# Patient Record
Sex: Female | Born: 1960 | Race: White | Hispanic: No | Marital: Married | Smoking: Current every day smoker
Health system: Southern US, Community
[De-identification: ages and names within clinical notes are randomized; demographics above are authoritative.]

## PROBLEM LIST (undated history)

## (undated) DIAGNOSIS — F17200 Nicotine dependence, unspecified, uncomplicated: Secondary | ICD-10-CM

## (undated) DIAGNOSIS — E7800 Pure hypercholesterolemia, unspecified: Secondary | ICD-10-CM

## (undated) DIAGNOSIS — G5600 Carpal tunnel syndrome, unspecified upper limb: Secondary | ICD-10-CM

## (undated) DIAGNOSIS — I700 Atherosclerosis of aorta: Secondary | ICD-10-CM

## (undated) DIAGNOSIS — K449 Diaphragmatic hernia without obstruction or gangrene: Secondary | ICD-10-CM

## (undated) DIAGNOSIS — D509 Iron deficiency anemia, unspecified: Secondary | ICD-10-CM

## (undated) DIAGNOSIS — I2510 Atherosclerotic heart disease of native coronary artery without angina pectoris: Secondary | ICD-10-CM

## (undated) HISTORY — PX: COLONOSCOPY: SHX174

## (undated) HISTORY — PX: ESOPHAGOGASTRODUODENOSCOPY: SHX1529

---

## 2005-01-02 ENCOUNTER — Ambulatory Visit: Payer: Self-pay | Admitting: Family Medicine

## 2005-02-28 ENCOUNTER — Ambulatory Visit: Payer: Self-pay | Admitting: Family Medicine

## 2007-04-28 ENCOUNTER — Ambulatory Visit: Payer: Self-pay | Admitting: Family Medicine

## 2008-07-12 ENCOUNTER — Ambulatory Visit: Payer: Self-pay | Admitting: Family Medicine

## 2009-07-26 ENCOUNTER — Ambulatory Visit: Payer: Self-pay | Admitting: Family Medicine

## 2010-10-01 ENCOUNTER — Ambulatory Visit: Payer: Self-pay | Admitting: Family Medicine

## 2014-04-21 ENCOUNTER — Ambulatory Visit: Payer: Self-pay | Admitting: Family Medicine

## 2014-05-11 ENCOUNTER — Ambulatory Visit: Payer: Self-pay | Admitting: Gastroenterology

## 2015-10-23 ENCOUNTER — Ambulatory Visit (INDEPENDENT_AMBULATORY_CARE_PROVIDER_SITE_OTHER): Payer: Managed Care, Other (non HMO) | Admitting: Sports Medicine

## 2015-10-23 ENCOUNTER — Ambulatory Visit (INDEPENDENT_AMBULATORY_CARE_PROVIDER_SITE_OTHER): Payer: Managed Care, Other (non HMO)

## 2015-10-23 ENCOUNTER — Encounter: Payer: Self-pay | Admitting: Podiatry

## 2015-10-23 VITALS — BP 129/71 | HR 101 | Resp 18

## 2015-10-23 DIAGNOSIS — M79672 Pain in left foot: Secondary | ICD-10-CM

## 2015-10-23 DIAGNOSIS — M79671 Pain in right foot: Secondary | ICD-10-CM

## 2015-10-23 DIAGNOSIS — R52 Pain, unspecified: Secondary | ICD-10-CM

## 2015-10-23 DIAGNOSIS — Q828 Other specified congenital malformations of skin: Secondary | ICD-10-CM

## 2015-10-23 MED ORDER — SILVER SULFADIAZINE 1 % EX CREA: 1.0000 "application " | TOPICAL_CREAM | Freq: Every day | CUTANEOUS | Status: DC

## 2015-10-23 MED ORDER — AMOXICILLIN-POT CLAVULANATE 875-125 MG PO TABS: 1.0000 | ORAL_TABLET | Freq: Two times a day (BID) | ORAL | Status: DC

## 2015-10-23 NOTE — Progress Notes (Deleted)
   Subjective:    Patient ID: Shari Melendez, female    DOB: 1961-03-13, 55 y.o.   MRN: [id number]  HPI I HAVE SOME CALLUSES ON BOTH OF MY BIG TOES AND I NEED MY CALLUSES TRIMMED UP AND I DO HAVE NEUROPATHY AND I AM A DIABETIC    Review of Systems  All other systems reviewed and are negative.      Objective:   Physical Exam        Assessment & Plan:

## 2015-10-23 NOTE — Progress Notes (Signed)
Patient ID: Shari Melendez, female   DOB: 01/29/1961, 55 y.o.   MRN: [id number] Subjective: Shari Melendez is a 55 y.o. female patient who presents to office for evaluation of Right> Left foot pain. Patient complains of pain at the lesions present Right>Left foot at the ball.  Patient has tried wart/callus remover, skin cream, and inserts and pads with no relief in symptoms. Patient denies any other pedal complaints.   There are no active problems to display for this patient.   No current outpatient prescriptions on file prior to visit.   No current facility-administered medications on file prior to visit.    Not on File  Objective:  General: Alert and oriented x3 in no acute distress  Dermatology: Keratotic lesions present sub met 3 and medial hallux on right and sub met 1, 5 and medial hallux on left with a central nucleated core noted, no webspace macerations, no ecchymosis bilateral, all nails x 10 are well manicured.  Vascular: Dorsalis Pedis and Posterior Tibial pedal pulses 2/4, Capillary Fill Time 3 seconds, + pedal hair growth bilateral, no edema bilateral lower extremities, Temperature gradient within normal limits.  Neurology: Johney Maine sensation intact via light touch bilateral.  Musculoskeletal: Mild tenderness with palpation at the lesion sites on Right>Left, Muscular strength 5/5 in all groups without pain or limitation on range of motion. Pes Cavus foot type bilateral.   Assessment and Plan: Problem List Items Addressed This Visit    None    Visit Diagnoses    Porokeratosis    -  Primary    Foot pain, bilateral          -Complete examination performed -Discussed treatment options of keratosis -Parred keratoic lesions using a chisel blade; treated the areas with Salinocaine covered with Moleskin; Keep intact for 1 day -Recommend daily skin emollients -Recommend good supportive shoes and inserts daily -Patient to return to office as needed or sooner if condition  worsens.  Landis Martins, DPM

## 2015-11-20 ENCOUNTER — Ambulatory Visit (INDEPENDENT_AMBULATORY_CARE_PROVIDER_SITE_OTHER): Payer: Managed Care, Other (non HMO) | Admitting: Sports Medicine

## 2015-11-20 ENCOUNTER — Telehealth: Payer: Self-pay | Admitting: *Deleted

## 2015-11-20 DIAGNOSIS — M79672 Pain in left foot: Secondary | ICD-10-CM | POA: Diagnosis not present

## 2015-11-20 DIAGNOSIS — M79671 Pain in right foot: Secondary | ICD-10-CM

## 2015-11-20 DIAGNOSIS — Q828 Other specified congenital malformations of skin: Secondary | ICD-10-CM

## 2015-11-20 NOTE — Telephone Encounter (Signed)
Left 5th MPJ plantar, and 1st MPJ skin wedge painful porokeratosis [facility]

## 2015-11-20 NOTE — Patient Instructions (Signed)

## 2015-11-20 NOTE — Progress Notes (Signed)
Patient ID: Shari Melendez, female   DOB: August 12, 1961, 55 y.o.   MRN: [id number]  Subjective: Shari Melendez is a 55 y.o. female patient who returns to office for evaluation of bilateral foot pain. Patient states that last trim helped tremendously but she wants the lesions cut out; desires to have the left foot done today and the right foot at a later time. Patient denies any other pedal complaints.   There are no active problems to display for this patient.   Current Outpatient Prescriptions on File Prior to Visit  Medication Sig Dispense Refill  . polyethylene glycol powder (GLYCOLAX/MIRALAX) powder      No current facility-administered medications on file prior to visit.    Not on File  Objective:  General: Alert and oriented x3 in no acute distress  Dermatology: Keratotic lesions <0.5cm present sub met 3 and medial hallux on right and sub met 1, 5 and medial hallux on left with a central nucleated core noted, no webspace macerations, no ecchymosis bilateral, all nails x 10 are well manicured.  Vascular: Dorsalis Pedis and Posterior Tibial pedal pulses 2/4, Capillary Fill Time 3 seconds, + pedal hair growth bilateral, no edema bilateral lower extremities, Temperature gradient within normal limits.  Neurology: Johney Maine sensation intact via light touch bilateral.  Musculoskeletal: Mild tenderness with palpation at the lesion sites on Left>Right, Muscular strength 5/5 in all groups without pain or limitation on range of motion. Pes Cavus foot type bilateral.   Assessment and Plan: Problem List Items Addressed This Visit    None    Visit Diagnoses    Porokeratosis    -  Primary    L>R     Relevant Orders    Dermatology pathology    Foot pain, bilateral          -Complete examination performed -Discussed treatment options of keratosis -After verbal consent for excision of left foot lesions injected a 3cc mixture of lidocaine and marine plain to sub met 1 and 5. A sterile betadine  prep was performed and then after anesthesia the lesions were excised in total and sent to [facility] for pathologic review. Treated areas with silver nitrate and dressed with neosporin, offloading pad, 4x4 and coban. Patient instructed to care for procedure sites daily with neosporin and bandage until healed -Dispensed post op shoe to use as instructed -Recommend ice, elevation and motrin as needed for pain -Advised patient to monitor for signs of infection if ocurs return to office or go to ER immediately -Recommend daily skin emollients for keratotic areas on contralateral side -Recommend good supportive shoes and inserts daily -Patient to return to office in 2 weeks to recheck excision sites or sooner if condition worsens.  Landis Martins, DPM

## 2015-12-07 ENCOUNTER — Encounter: Payer: Self-pay | Admitting: Sports Medicine

## 2015-12-07 ENCOUNTER — Ambulatory Visit (INDEPENDENT_AMBULATORY_CARE_PROVIDER_SITE_OTHER): Payer: Managed Care, Other (non HMO) | Admitting: Sports Medicine

## 2015-12-07 DIAGNOSIS — M79671 Pain in right foot: Secondary | ICD-10-CM

## 2015-12-07 DIAGNOSIS — Q828 Other specified congenital malformations of skin: Secondary | ICD-10-CM

## 2015-12-07 DIAGNOSIS — M6530 Trigger finger, unspecified finger: Secondary | ICD-10-CM | POA: Insufficient documentation

## 2015-12-07 DIAGNOSIS — M79672 Pain in left foot: Secondary | ICD-10-CM

## 2015-12-07 DIAGNOSIS — Z09 Encounter for follow-up examination after completed treatment for conditions other than malignant neoplasm: Secondary | ICD-10-CM

## 2015-12-07 DIAGNOSIS — G5600 Carpal tunnel syndrome, unspecified upper limb: Secondary | ICD-10-CM | POA: Insufficient documentation

## 2015-12-08 NOTE — Progress Notes (Signed)
Patient ID: Shari Melendez, female   DOB: Aug 20, 1960, 55 y.o.   MRN: [id number] Subjective: Shari Melendez is a 55 y.o. female patient who returns to office for evaluation s/p excision of porokeratosis left foot; patient reports that her foot feels better; she has been dressing sites with neosporin and bandaid without complication. Patient desires to have the lesion on her right foot taken off as well. Patient denies any other pedal complaints.   Patient Active Problem List   Diagnosis Date Noted  . Carpal tunnel syndrome 12/07/2015  . Triggering of digit 12/07/2015    No current outpatient prescriptions on file prior to visit.   No current facility-administered medications on file prior to visit.    Allergies  Allergen Reactions  . Sulfa Antibiotics Other (See Comments)    Other Reaction: SWELLING AND HIVES    Objective:  General: Alert and oriented x3 in no acute distress  Dermatology: Keratotic lesion present <0.5cm sub met 3> medial hallux on right with central core, Excision sites on left foot are healing well with minimal reactive keratosis, no webspace macerations, no ecchymosis bilateral, all nails x 10 are well manicured.  Vascular: Dorsalis Pedis and Posterior Tibial pedal pulses 2/4, Capillary Fill Time 3 seconds, + pedal hair growth bilateral, no edema bilateral lower extremities, Temperature gradient within normal limits.  Neurology: Johney Maine sensation intact via light touch bilateral.  Musculoskeletal: Mild tenderness with palpation at the lesion site sub met 3>medial hallux on Right, No pain to excision sites on left, Muscular strength 5/5 in all groups without pain or limitation on range of motion. Pes Cavus foot type bilateral.  Assessment and Plan: Problem List Items Addressed This Visit    None    Visit Diagnoses    S/P excision of skin lesion, follow-up exam    -  Primary    Left sub met 1 and 5    Porokeratosis        Foot pain, bilateral            -Complete examination performed -Discussed treatment options -Parred keratoic lesion using a chisel blade; treated the area with Salinocaine covered with bandaid on right sub met 3; will plan for excision of this lesion at next visit -On left excision sites are well healed, parred reactive keratosis at sites with sterile chisel blade without complication; no need at this time for dressings; recommend daily inspection -Recommend good supportive shoes and inserts -Recommend daily skin emollients -Patient to return to office for right foot excision or sooner if condition worsens.  Landis Martins, DPM

## 2016-01-04 ENCOUNTER — Encounter: Payer: Self-pay | Admitting: Sports Medicine

## 2016-01-04 ENCOUNTER — Ambulatory Visit (INDEPENDENT_AMBULATORY_CARE_PROVIDER_SITE_OTHER): Payer: Managed Care, Other (non HMO) | Admitting: Sports Medicine

## 2016-01-04 DIAGNOSIS — M79672 Pain in left foot: Secondary | ICD-10-CM

## 2016-01-04 DIAGNOSIS — Q828 Other specified congenital malformations of skin: Secondary | ICD-10-CM | POA: Diagnosis not present

## 2016-01-04 DIAGNOSIS — M79671 Pain in right foot: Secondary | ICD-10-CM

## 2016-01-04 NOTE — Progress Notes (Signed)
Patient ID: Shari Melendez, female   DOB: Sep 14, 1960, 55 y.o.   MRN: [id number] Subjective: Shari Melendez is a 55 y.o. female patient who returns to office for evaluation of bilateral foot pain. Patient states that her left foot is doing much better s/p Excision of porokeratosis. Desires to have the right foot keratotic lesion excised today. Patient denies any other pedal complaints.   Patient Active Problem List   Diagnosis Date Noted  . Carpal tunnel syndrome 12/07/2015  . Triggering of digit 12/07/2015    No current outpatient prescriptions on file prior to visit.   No current facility-administered medications on file prior to visit.    Allergies  Allergen Reactions  . Sulfa Antibiotics Other (See Comments)    Other Reaction: SWELLING AND HIVES    Objective:  General: Alert and oriented x3 in no acute distress  Dermatology: Keratotic lesions <0.5cm present sub met 3 and medial hallux on right with a central nucleated core noted and sub met 1, 5 and medial hallux on left that is reactive s/p excision, no webspace macerations, no ecchymosis bilateral, all nails x 10 are well manicured.  Vascular: Dorsalis Pedis and Posterior Tibial pedal pulses 2/4, Capillary Fill Time 3 seconds, + pedal hair growth bilateral, no edema bilateral lower extremities, Temperature gradient within normal limits.  Neurology: Johney Maine sensation intact via light touch bilateral.  Musculoskeletal: Mild tenderness with palpation at the lesion sites on Left<Right, Muscular strength 5/5 in all groups without pain or limitation on range of motion. Pes Cavus foot type bilateral.   Assessment and Plan: Problem List Items Addressed This Visit    None    Visit Diagnoses    Porokeratosis    -  Primary    Relevant Orders    Wound culture    Foot pain, bilateral          -Complete examination performed -Discussed treatment options of keratosis -After verbal consent for excision of left foot lesions injected a  3cc mixture of lidocaine and marine plain to sub met 3 on right and a sterile betadine prep was performed and then after anesthesia the lesion was excised in total and sent for pathologic review. Treated areas with silver nitrate and dressed with neosporin, 4x4 and coban. Patient instructed to care for procedure sites daily with neosporin and bandage until healed -Advised patient to use post op shoe on right -Recommend ice, elevation and motrin as needed for pain -Advised patient to monitor for signs of infection if ocurs return to office or go to ER immediately -Recommend daily skin emollients for keratotic areas on contralateral side -Patient to return to office in 1-2 weeks to recheck excision sites or sooner if condition worsens.  Landis Martins, DPM

## 2016-01-22 ENCOUNTER — Ambulatory Visit (INDEPENDENT_AMBULATORY_CARE_PROVIDER_SITE_OTHER): Payer: Managed Care, Other (non HMO) | Admitting: Sports Medicine

## 2016-01-22 ENCOUNTER — Encounter: Payer: Self-pay | Admitting: Sports Medicine

## 2016-01-22 DIAGNOSIS — M79671 Pain in right foot: Secondary | ICD-10-CM

## 2016-01-22 DIAGNOSIS — Z09 Encounter for follow-up examination after completed treatment for conditions other than malignant neoplasm: Secondary | ICD-10-CM

## 2016-01-22 DIAGNOSIS — M79672 Pain in left foot: Secondary | ICD-10-CM

## 2016-01-22 NOTE — Progress Notes (Signed)
Patient ID: Shari LargeLisa D Calabrese, female   DOB: [birth year], 55 y.o.   MRN: [id number]  Subjective: Shari Melendez is a 55 y.o. female patient who returns to office for evaluation s/p excision of porokeratosis right foot; patient reports that her foot feels better; she has been dressing sites with neosporin and bandaid without complication. Patient denies consitutional symptoms. Patient denies any other pedal complaints.   Patient Active Problem List   Diagnosis Date Noted  . Carpal tunnel syndrome 12/07/2015  . Triggering of digit 12/07/2015    No current outpatient prescriptions on file prior to visit.   No current facility-administered medications on file prior to visit.    Allergies  Allergen Reactions  . Sulfa Antibiotics Other (See Comments)    Other Reaction: SWELLING AND HIVES    Objective:  General: Alert and oriented x3 in no acute distress  Dermatology: Left foot excision sites well healed with mild reactive keratosis present. Most recent, Excision site on right foot is healing well with minimal reactive keratosis, no webspace macerations, no ecchymosis bilateral, all nails x 10 are well manicured.  Vascular: Dorsalis Pedis and Posterior Tibial pedal pulses 2/4, Capillary Fill Time 3 seconds, + pedal hair growth bilateral, no edema bilateral lower extremities, Temperature gradient within normal limits.  Neurology: Gross sensation intact via light touch bilateral.  Musculoskeletal: No tenderness to palpation bilateral, Muscular strength 5/5 in all groups without pain or limitation on range of motion. Pes Cavus foot type bilateral.  Path ([facility])- [facility]  Assessment and Plan: Problem List Items Addressed This Visit    None    Visit Diagnoses    S/P excision of skin lesion, follow-up exam    -  Primary    Right foot    Foot pain, bilateral        Improved      -Complete examination performed -Path reviewed  -On right excision site is well healed, parred reactive keratosis at  site with sterile chisel blade without complication; applied antibiotic cream and bandaid, advised patient to continue with this same dressing for 1 week, Soaks are NO LONGER needed; recommend daily inspection -Recommend good supportive shoes and inserts -Recommend daily skin emollients -Patient to return to office as needed or sooner if condition worsens.  Asencion Islamitorya , DPM

## 2016-01-23 ENCOUNTER — Encounter: Payer: Self-pay | Admitting: Sports Medicine

## 2017-03-30 ENCOUNTER — Telehealth: Payer: Self-pay | Admitting: *Deleted

## 2017-03-30 DIAGNOSIS — Z122 Encounter for screening for malignant neoplasm of respiratory organs: Secondary | ICD-10-CM

## 2017-03-30 DIAGNOSIS — Z87891 Personal history of nicotine dependence: Secondary | ICD-10-CM

## 2017-03-30 NOTE — Telephone Encounter (Signed)
Received referral for initial lung cancer screening scan. Contacted patient and obtained smoking history,(current, 38 pack year) as well as answering questions related to screening process. Patient denies signs of lung cancer such as weight loss or hemoptysis. Patient denies comorbidity that would prevent curative treatment if lung cancer were found. Patient is scheduled for shared decision making visit and CT scan on 04/14/17.

## 2017-04-06 ENCOUNTER — Other Ambulatory Visit: Payer: Self-pay | Admitting: Family Medicine

## 2017-04-06 DIAGNOSIS — Z1231 Encounter for screening mammogram for malignant neoplasm of breast: Secondary | ICD-10-CM

## 2017-04-13 ENCOUNTER — Encounter: Payer: Self-pay | Admitting: Oncology

## 2017-04-14 ENCOUNTER — Inpatient Hospital Stay: Payer: Managed Care, Other (non HMO) | Attending: Oncology | Admitting: Oncology

## 2017-04-14 ENCOUNTER — Ambulatory Visit
Admission: RE | Admit: 2017-04-14 | Discharge: 2017-04-14 | Disposition: A | Discharge status: Home / Self Care | Payer: Managed Care, Other (non HMO) | Source: Ambulatory Visit | Attending: Oncology | Admitting: Oncology

## 2017-04-14 DIAGNOSIS — I700 Atherosclerosis of aorta: Secondary | ICD-10-CM | POA: Diagnosis not present

## 2017-04-14 DIAGNOSIS — Z122 Encounter for screening for malignant neoplasm of respiratory organs: Secondary | ICD-10-CM | POA: Insufficient documentation

## 2017-04-14 DIAGNOSIS — J439 Emphysema, unspecified: Secondary | ICD-10-CM | POA: Diagnosis not present

## 2017-04-14 DIAGNOSIS — I2510 Atherosclerotic heart disease of native coronary artery without angina pectoris: Secondary | ICD-10-CM | POA: Diagnosis not present

## 2017-04-14 DIAGNOSIS — F17210 Nicotine dependence, cigarettes, uncomplicated: Secondary | ICD-10-CM

## 2017-04-14 DIAGNOSIS — Z87891 Personal history of nicotine dependence: Secondary | ICD-10-CM | POA: Insufficient documentation

## 2017-04-14 NOTE — Progress Notes (Signed)
In accordance with CMS guidelines, patient has met eligibility criteria including age, absence of signs or symptoms of lung cancer.  Social History  Substance Use Topics  . Smoking status: Current Every Day Smoker    Packs/day: 1.00    Years: 38.00  . Smokeless tobacco: Never Used  . Alcohol use No     A shared decision-making session was conducted prior to the performance of CT scan. This includes one or more decision aids, includes benefits and harms of screening, follow-up diagnostic testing, over-diagnosis, false positive rate, and total radiation exposure.  Counseling on the importance of adherence to annual lung cancer LDCT screening, impact of co-morbidities, and ability or willingness to undergo diagnosis and treatment is imperative for compliance of the program.  Counseling on the importance of continued smoking cessation for former smokers; the importance of smoking cessation for current smokers, and information about tobacco cessation interventions have been given to patient including [place] and [facility] quit [place] programs.  Written order for lung cancer screening with LDCT has been given to the patient and any and all questions have been answered to the best of my abilities.   Yearly follow up will be coordinated by Burgess Estelle, Thoracic Navigator.  Faythe Casa, NP 04/14/2017 1:31 PM

## 2017-04-17 ENCOUNTER — Encounter: Payer: Self-pay | Admitting: *Deleted

## 2017-05-08 ENCOUNTER — Ambulatory Visit
Admission: RE | Admit: 2017-05-08 | Discharge: 2017-05-08 | Disposition: A | Discharge status: Home / Self Care | Payer: Managed Care, Other (non HMO) | Source: Ambulatory Visit | Attending: Family Medicine | Admitting: Family Medicine

## 2017-05-08 ENCOUNTER — Encounter: Payer: Self-pay | Admitting: Radiology

## 2017-05-08 DIAGNOSIS — Z1231 Encounter for screening mammogram for malignant neoplasm of breast: Secondary | ICD-10-CM | POA: Insufficient documentation

## 2018-04-03 ENCOUNTER — Telehealth: Payer: Self-pay

## 2018-04-03 NOTE — Telephone Encounter (Signed)
Call pt regarding lung screening. Pt is a current smoker. Smokes about 1/2 pack per day. Scan can be any day and time.

## 2018-04-05 ENCOUNTER — Other Ambulatory Visit: Payer: Self-pay | Admitting: *Deleted

## 2018-04-05 DIAGNOSIS — Z122 Encounter for screening for malignant neoplasm of respiratory organs: Secondary | ICD-10-CM

## 2018-04-06 ENCOUNTER — Telehealth: Payer: Self-pay | Admitting: *Deleted

## 2018-04-06 NOTE — Telephone Encounter (Signed)
Called patient to discuss her appt for LDCT screening on Thursday 04/08/2018 @ 8:15am here @ [facility], unable to reach her by phone, message left with appt information.

## 2018-04-08 ENCOUNTER — Telehealth: Payer: Self-pay | Admitting: *Deleted

## 2018-04-08 ENCOUNTER — Ambulatory Visit: Admission: RE | Admit: 2018-04-08 | Payer: Managed Care, Other (non HMO) | Source: Ambulatory Visit

## 2018-04-08 NOTE — Telephone Encounter (Signed)
Called patient to inform her of her appt for LDCT screening on Monday 04/12/2018 @ 9:40am here @ [facility]. Voiced understanding.

## 2018-04-08 NOTE — Telephone Encounter (Signed)
Attempted to contact patient r/t LDCT Screening follow was due today, but patient was a no show to their appt.   No answer received, message left for patient to call [phone] to schedule appointment.

## 2018-04-12 ENCOUNTER — Ambulatory Visit
Admission: RE | Admit: 2018-04-12 | Discharge: 2018-04-12 | Disposition: A | Discharge status: Home / Self Care | Payer: Managed Care, Other (non HMO) | Source: Ambulatory Visit | Attending: Nurse Practitioner | Admitting: Nurse Practitioner

## 2018-04-12 DIAGNOSIS — I700 Atherosclerosis of aorta: Secondary | ICD-10-CM | POA: Diagnosis not present

## 2018-04-12 DIAGNOSIS — I2510 Atherosclerotic heart disease of native coronary artery without angina pectoris: Secondary | ICD-10-CM | POA: Insufficient documentation

## 2018-04-12 DIAGNOSIS — Z87891 Personal history of nicotine dependence: Secondary | ICD-10-CM | POA: Insufficient documentation

## 2018-04-12 DIAGNOSIS — Z122 Encounter for screening for malignant neoplasm of respiratory organs: Secondary | ICD-10-CM | POA: Insufficient documentation

## 2018-04-12 DIAGNOSIS — J432 Centrilobular emphysema: Secondary | ICD-10-CM | POA: Insufficient documentation

## 2018-04-12 DIAGNOSIS — J438 Other emphysema: Secondary | ICD-10-CM | POA: Insufficient documentation

## 2018-04-13 ENCOUNTER — Other Ambulatory Visit: Payer: Self-pay | Admitting: Family Medicine

## 2018-04-13 DIAGNOSIS — Z1231 Encounter for screening mammogram for malignant neoplasm of breast: Secondary | ICD-10-CM

## 2018-04-16 ENCOUNTER — Encounter: Payer: Self-pay | Admitting: *Deleted

## 2018-04-20 DIAGNOSIS — Z720 Tobacco use: Secondary | ICD-10-CM | POA: Insufficient documentation

## 2018-05-12 ENCOUNTER — Ambulatory Visit
Admission: RE | Admit: 2018-05-12 | Discharge: 2018-05-12 | Disposition: A | Discharge status: Home / Self Care | Payer: Managed Care, Other (non HMO) | Source: Ambulatory Visit | Attending: Family Medicine | Admitting: Family Medicine

## 2018-05-12 DIAGNOSIS — Z1231 Encounter for screening mammogram for malignant neoplasm of breast: Secondary | ICD-10-CM | POA: Diagnosis present

## 2019-04-21 ENCOUNTER — Telehealth: Payer: Self-pay | Admitting: *Deleted

## 2019-04-21 NOTE — Telephone Encounter (Signed)
Left message for patient to notify them that it is time to schedule annual low dose lung cancer screening CT scan. Instructed patient to call back to verify information prior to the scan being scheduled.  

## 2019-06-09 ENCOUNTER — Other Ambulatory Visit: Payer: Self-pay | Admitting: Family Medicine

## 2019-07-19 ENCOUNTER — Telehealth: Payer: Self-pay

## 2019-07-19 NOTE — Telephone Encounter (Signed)
Left message for patient to notify them that it is time to schedule annual low dose lung cancer screening CT scan. Instructed patient to call back ([phone]) to verify information prior to the scan being scheduled.  

## 2019-07-26 ENCOUNTER — Encounter: Payer: Self-pay | Admitting: *Deleted

## 2019-09-26 DIAGNOSIS — I2510 Atherosclerotic heart disease of native coronary artery without angina pectoris: Secondary | ICD-10-CM | POA: Insufficient documentation

## 2019-09-27 ENCOUNTER — Other Ambulatory Visit: Payer: Self-pay | Admitting: Family Medicine

## 2019-09-27 DIAGNOSIS — Z1231 Encounter for screening mammogram for malignant neoplasm of breast: Secondary | ICD-10-CM

## 2019-09-30 ENCOUNTER — Telehealth: Payer: Self-pay | Admitting: *Deleted

## 2019-09-30 DIAGNOSIS — Z87891 Personal history of nicotine dependence: Secondary | ICD-10-CM

## 2019-09-30 NOTE — Telephone Encounter (Signed)
Patient has been notified that annual lung cancer screening low dose CT scan is due currently or will be in near future. Confirmed that patient is within the age range of 55-77, and asymptomatic, (no signs or symptoms of lung cancer). Patient denies illness that would prevent curative treatment for lung cancer if found. Verified smoking history, (current, 38.25 pack year). The shared decision making visit was done 04/14/17. Patient is agreeable for CT scan being scheduled.

## 2019-10-11 ENCOUNTER — Other Ambulatory Visit: Payer: Self-pay

## 2019-10-11 ENCOUNTER — Ambulatory Visit
Admission: RE | Admit: 2019-10-11 | Discharge: 2019-10-11 | Disposition: A | Discharge status: Home / Self Care | Payer: Managed Care, Other (non HMO) | Source: Ambulatory Visit | Attending: Oncology | Admitting: Oncology

## 2019-10-11 DIAGNOSIS — Z87891 Personal history of nicotine dependence: Secondary | ICD-10-CM | POA: Insufficient documentation

## 2019-10-14 ENCOUNTER — Encounter: Payer: Self-pay | Admitting: *Deleted

## 2019-11-08 ENCOUNTER — Ambulatory Visit
Admission: RE | Admit: 2019-11-08 | Discharge: 2019-11-08 | Disposition: A | Discharge status: Home / Self Care | Payer: Managed Care, Other (non HMO) | Source: Ambulatory Visit | Attending: Family Medicine | Admitting: Family Medicine

## 2019-11-08 DIAGNOSIS — Z1231 Encounter for screening mammogram for malignant neoplasm of breast: Secondary | ICD-10-CM

## 2019-12-26 DIAGNOSIS — E7800 Pure hypercholesterolemia, unspecified: Secondary | ICD-10-CM | POA: Insufficient documentation

## 2020-10-10 ENCOUNTER — Telehealth: Payer: Self-pay | Admitting: *Deleted

## 2020-10-10 NOTE — Telephone Encounter (Signed)
Attempted to contact and schedule lung screening scan. Message left for patient to call back to schedule. 

## 2020-10-11 ENCOUNTER — Other Ambulatory Visit: Payer: Self-pay | Admitting: Family Medicine

## 2020-10-11 ENCOUNTER — Other Ambulatory Visit: Payer: Self-pay | Admitting: *Deleted

## 2020-10-11 DIAGNOSIS — F17200 Nicotine dependence, unspecified, uncomplicated: Secondary | ICD-10-CM

## 2020-10-11 DIAGNOSIS — Z1231 Encounter for screening mammogram for malignant neoplasm of breast: Secondary | ICD-10-CM

## 2020-10-11 DIAGNOSIS — Z87891 Personal history of nicotine dependence: Secondary | ICD-10-CM

## 2020-10-11 DIAGNOSIS — Z122 Encounter for screening for malignant neoplasm of respiratory organs: Secondary | ICD-10-CM

## 2020-10-11 NOTE — Progress Notes (Signed)
Contacted and scheduled for annual lung cancer screening scan. Patient is a current smoker, 38.5 pack year history.

## 2020-10-19 ENCOUNTER — Other Ambulatory Visit: Payer: Self-pay

## 2020-10-19 ENCOUNTER — Ambulatory Visit
Admission: RE | Admit: 2020-10-19 | Discharge: 2020-10-19 | Disposition: A | Discharge status: Home / Self Care | Payer: No Typology Code available for payment source | Source: Ambulatory Visit | Attending: Nurse Practitioner | Admitting: Nurse Practitioner

## 2020-10-19 DIAGNOSIS — Z122 Encounter for screening for malignant neoplasm of respiratory organs: Secondary | ICD-10-CM | POA: Diagnosis present

## 2020-10-19 DIAGNOSIS — Z87891 Personal history of nicotine dependence: Secondary | ICD-10-CM | POA: Insufficient documentation

## 2020-10-19 DIAGNOSIS — F17200 Nicotine dependence, unspecified, uncomplicated: Secondary | ICD-10-CM | POA: Insufficient documentation

## 2020-10-26 ENCOUNTER — Encounter: Payer: Self-pay | Admitting: *Deleted

## 2020-11-08 ENCOUNTER — Ambulatory Visit
Admission: RE | Admit: 2020-11-08 | Discharge: 2020-11-08 | Disposition: A | Discharge status: Home / Self Care | Payer: No Typology Code available for payment source | Source: Ambulatory Visit | Attending: Family Medicine | Admitting: Family Medicine

## 2020-11-08 ENCOUNTER — Other Ambulatory Visit: Payer: Self-pay

## 2020-11-08 DIAGNOSIS — Z1231 Encounter for screening mammogram for malignant neoplasm of breast: Secondary | ICD-10-CM | POA: Diagnosis present

## 2021-12-06 ENCOUNTER — Telehealth: Payer: Self-pay | Admitting: *Deleted

## 2021-12-06 NOTE — Telephone Encounter (Signed)
LMTC to schedule Yearly Lung CA CT Scan. 

## 2022-10-20 ENCOUNTER — Other Ambulatory Visit: Payer: Self-pay | Admitting: Family Medicine

## 2022-10-20 DIAGNOSIS — Z1231 Encounter for screening mammogram for malignant neoplasm of breast: Secondary | ICD-10-CM

## 2022-11-05 ENCOUNTER — Ambulatory Visit
Admission: RE | Admit: 2022-11-05 | Discharge: 2022-11-05 | Disposition: A | Discharge status: Home / Self Care | Payer: No Typology Code available for payment source | Source: Ambulatory Visit | Attending: Family Medicine | Admitting: Family Medicine

## 2022-11-05 DIAGNOSIS — Z1231 Encounter for screening mammogram for malignant neoplasm of breast: Secondary | ICD-10-CM | POA: Insufficient documentation

## 2022-11-07 ENCOUNTER — Other Ambulatory Visit: Payer: Self-pay | Admitting: Family Medicine

## 2022-11-07 DIAGNOSIS — R928 Other abnormal and inconclusive findings on diagnostic imaging of breast: Secondary | ICD-10-CM

## 2022-11-07 DIAGNOSIS — N6489 Other specified disorders of breast: Secondary | ICD-10-CM

## 2022-11-11 ENCOUNTER — Ambulatory Visit
Admission: RE | Admit: 2022-11-11 | Discharge: 2022-11-11 | Disposition: A | Discharge status: Home / Self Care | Payer: No Typology Code available for payment source | Source: Ambulatory Visit | Attending: Family Medicine | Admitting: Family Medicine

## 2022-11-11 DIAGNOSIS — R928 Other abnormal and inconclusive findings on diagnostic imaging of breast: Secondary | ICD-10-CM

## 2022-11-11 DIAGNOSIS — N6489 Other specified disorders of breast: Secondary | ICD-10-CM | POA: Insufficient documentation

## 2023-02-20 IMAGING — MG MM DIGITAL SCREENING BILAT W/ TOMO AND CAD
8 series · 8 of 24 positions shown · non-contrast
Comparison: Previous exam(s).

CLINICAL DATA: Screening.

EXAM:
DIGITAL SCREENING BILATERAL MAMMOGRAM WITH TOMOSYNTHESIS AND CAD
TECHNIQUE: Bilateral screening digital craniocaudal and mediolateral oblique
mammograms were obtained. Bilateral screening digital breast
tomosynthesis was performed. The images were evaluated with
computer-aided detection.

[R CC synth-2D]
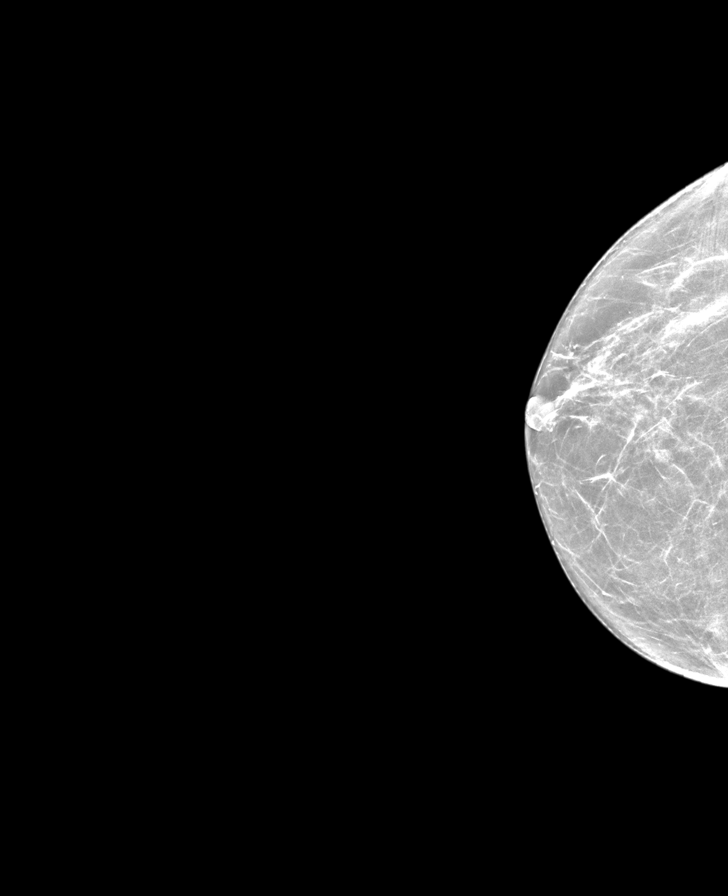

[R MLO synth-2D]
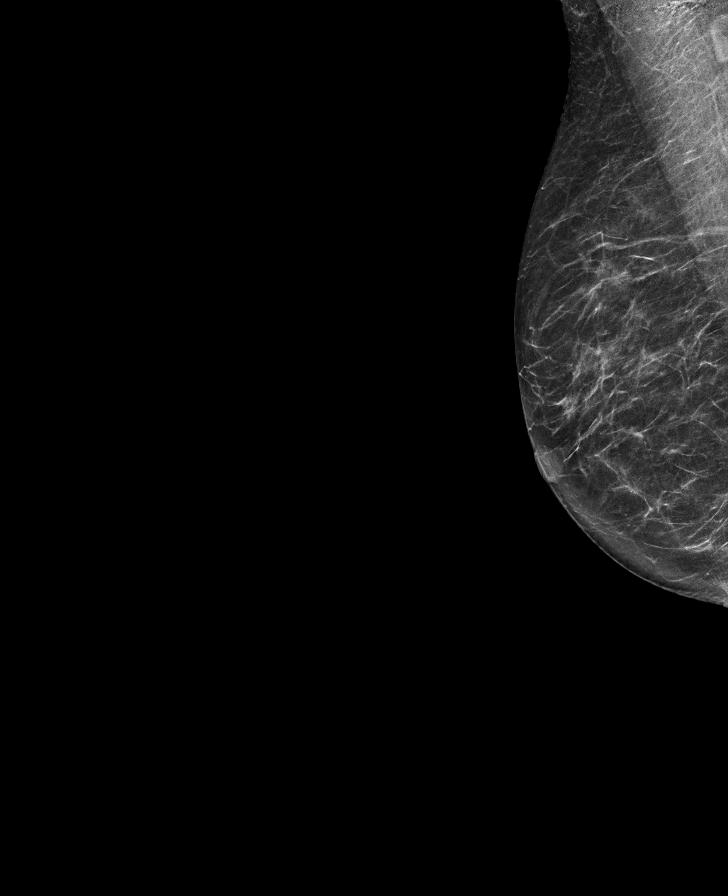

[L CC synth-2D]
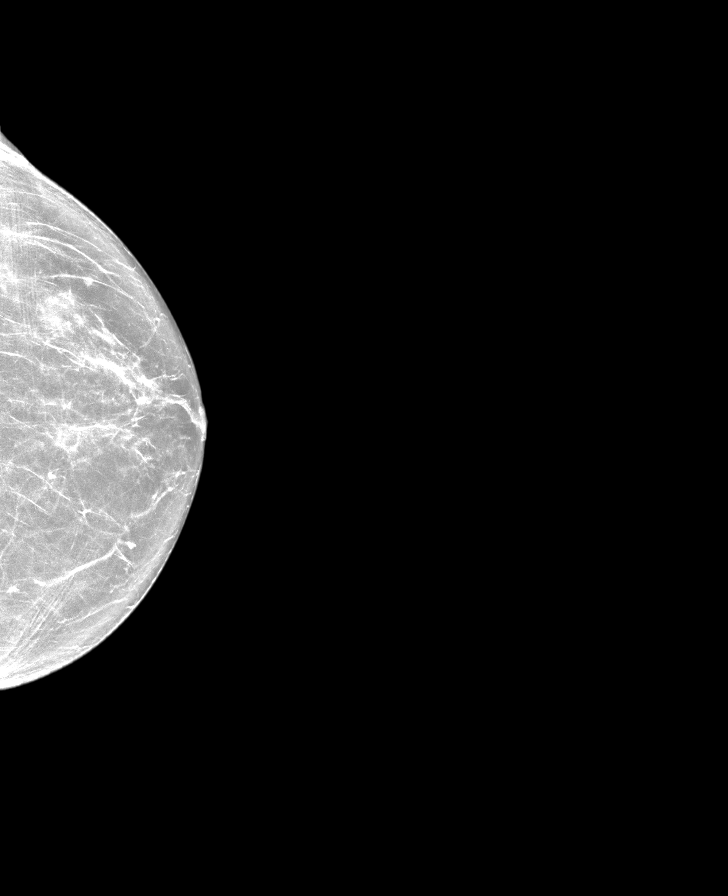

[L MLO synth-2D]
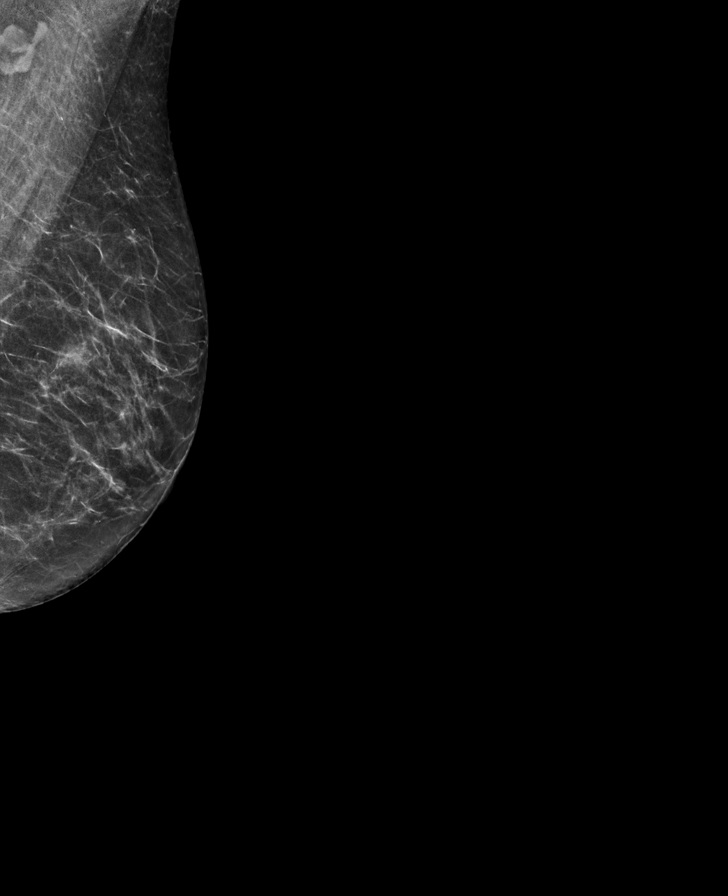

[R MLO tomo · tomo slice 31/61.0]
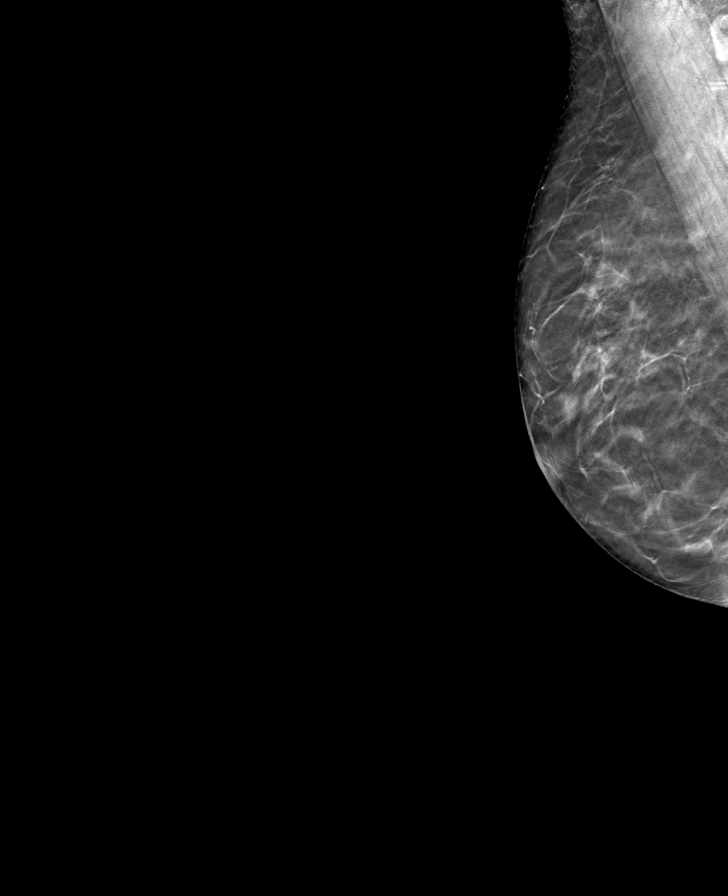

[L CC tomo · tomo slice 23/46.0]
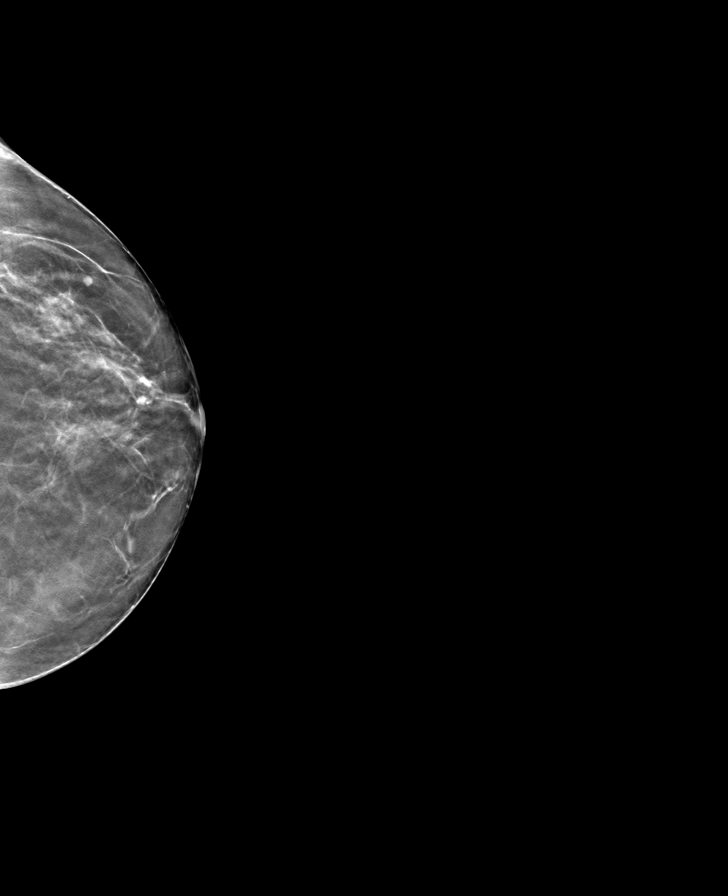

[L MLO tomo · tomo slice 31/60.0]
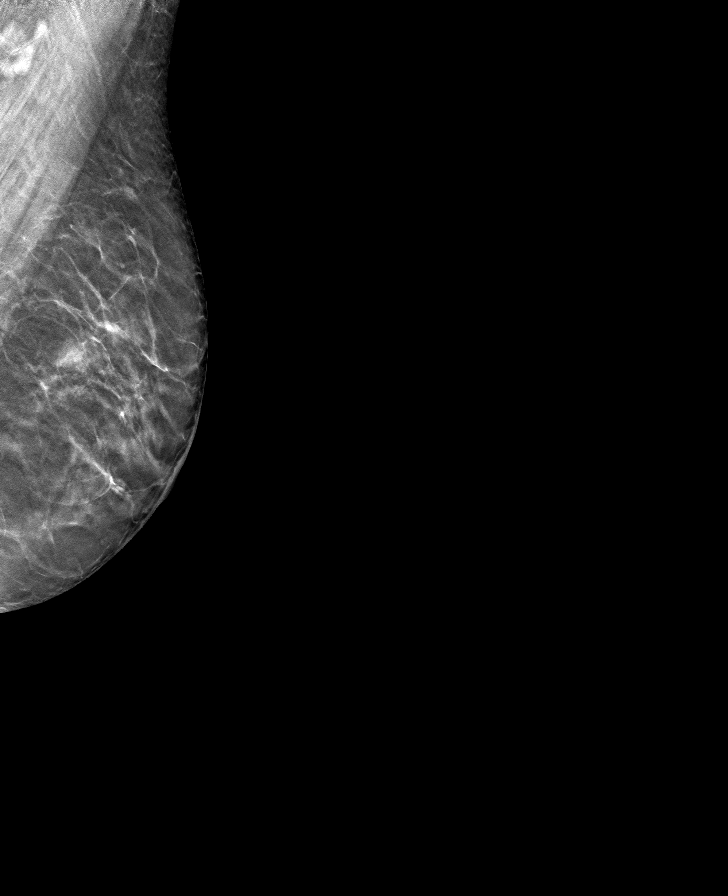

[R CC tomo · tomo slice 33/64.0]
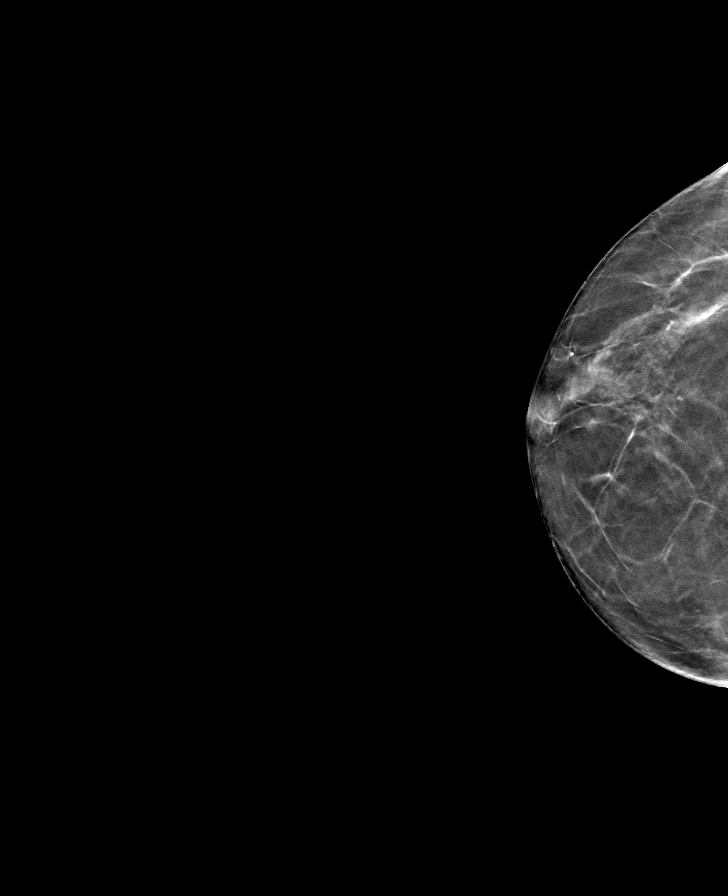

[8 of 24 positions shown; findings below may reference images not displayed]

ACR Breast Density Category b: There are scattered areas of
fibroglandular density.
FINDINGS: There are no findings suspicious for malignancy. The images were
evaluated with computer-aided detection.
IMPRESSION: No mammographic evidence of malignancy. A result letter of this
screening mammogram will be mailed directly to the patient.

RECOMMENDATION:
Screening mammogram in one year. (Code:WJ-I-BG6)

BI-RADS CATEGORY  1: Negative.

## 2023-04-03 ENCOUNTER — Ambulatory Visit: Payer: No Typology Code available for payment source

## 2023-04-03 DIAGNOSIS — C189 Malignant neoplasm of colon, unspecified: Secondary | ICD-10-CM

## 2023-04-03 DIAGNOSIS — D509 Iron deficiency anemia, unspecified: Secondary | ICD-10-CM | POA: Diagnosis not present

## 2023-04-03 DIAGNOSIS — C180 Malignant neoplasm of cecum: Secondary | ICD-10-CM | POA: Diagnosis present

## 2023-04-03 DIAGNOSIS — K449 Diaphragmatic hernia without obstruction or gangrene: Secondary | ICD-10-CM | POA: Diagnosis not present

## 2023-04-03 HISTORY — DX: Malignant neoplasm of colon, unspecified: C18.9

## 2023-04-07 ENCOUNTER — Other Ambulatory Visit: Payer: Self-pay | Admitting: Gastroenterology

## 2023-04-07 DIAGNOSIS — C180 Malignant neoplasm of cecum: Secondary | ICD-10-CM

## 2023-04-08 ENCOUNTER — Encounter: Payer: Self-pay | Admitting: Gastroenterology

## 2023-04-09 ENCOUNTER — Inpatient Hospital Stay: Payer: No Typology Code available for payment source | Attending: Internal Medicine | Admitting: Internal Medicine

## 2023-04-09 ENCOUNTER — Encounter: Payer: Self-pay | Admitting: Internal Medicine

## 2023-04-09 ENCOUNTER — Inpatient Hospital Stay: Payer: No Typology Code available for payment source

## 2023-04-09 VITALS — BP 145/71 | HR 69 | Temp 98.4°F | Ht 64.0 in | Wt 120.8 lb

## 2023-04-09 DIAGNOSIS — Z803 Family history of malignant neoplasm of breast: Secondary | ICD-10-CM | POA: Diagnosis not present

## 2023-04-09 DIAGNOSIS — D509 Iron deficiency anemia, unspecified: Secondary | ICD-10-CM | POA: Diagnosis not present

## 2023-04-09 DIAGNOSIS — D649 Anemia, unspecified: Secondary | ICD-10-CM

## 2023-04-09 DIAGNOSIS — C180 Malignant neoplasm of cecum: Secondary | ICD-10-CM | POA: Diagnosis not present

## 2023-04-09 DIAGNOSIS — F17210 Nicotine dependence, cigarettes, uncomplicated: Secondary | ICD-10-CM | POA: Insufficient documentation

## 2023-04-09 DIAGNOSIS — C189 Malignant neoplasm of colon, unspecified: Secondary | ICD-10-CM

## 2023-04-09 LAB — COMPREHENSIVE METABOLIC PANEL
ALT: 17 U/L (ref 0–44)
AST: 19 U/L (ref 15–41)
Albumin: 4.2 g/dL (ref 3.5–5.0)
Alkaline Phosphatase: 89 U/L (ref 38–126)
Anion gap: 8 (ref 5–15)
BUN: 8 mg/dL (ref 8–23)
CO2: 26 mmol/L (ref 22–32)
Calcium: 9 mg/dL (ref 8.9–10.3)
Chloride: 103 mmol/L (ref 98–111)
Creatinine, Ser: 0.63 mg/dL (ref 0.44–1.00)
GFR, Estimated: >60 mL/min (ref >60)
Glucose, Bld: 102 mg/dL — ABNORMAL HIGH (ref 70–99)
Potassium: 3.8 mmol/L (ref 3.5–5.1)
Sodium: 137 mmol/L (ref 135–145)
Total Bilirubin: 0.3 mg/dL (ref 0.3–1.2)
Total Protein: 7.7 g/dL (ref 6.5–8.1)

## 2023-04-09 LAB — CBC WITH DIFFERENTIAL/PLATELET
Abs Immature Granulocytes: 0.04 10*3/uL (ref 0.00–0.07)
Basophils Absolute: 0.1 10*3/uL (ref 0.0–0.1)
Basophils Relative: 1 %
Eosinophils Absolute: 0.2 10*3/uL (ref 0.0–0.5)
Eosinophils Relative: 3 %
HCT: 33.9 % — ABNORMAL LOW (ref 36.0–46.0)
Hemoglobin: 11 g/dL — ABNORMAL LOW (ref 12.0–15.0)
Immature Granulocytes: 0 %
Lymphocytes Relative: 25 %
Lymphs Abs: 2.3 10*3/uL (ref 0.7–4.0)
MCH: 29.4 pg (ref 26.0–34.0)
MCHC: 32.4 g/dL (ref 30.0–36.0)
MCV: 90.6 fL (ref 80.0–100.0)
Monocytes Absolute: 0.7 10*3/uL (ref 0.1–1.0)
Monocytes Relative: 7 %
Neutro Abs: 6.1 10*3/uL (ref 1.7–7.7)
Neutrophils Relative %: 64 %
Platelets: 288 10*3/uL (ref 150–400)
RBC: 3.74 MIL/uL — ABNORMAL LOW (ref 3.87–5.11)
RDW: 15.2 % (ref 11.5–15.5)
WBC: 9.5 10*3/uL (ref 4.0–10.5)
nRBC: 0 % (ref 0.0–0.2)

## 2023-04-09 LAB — IRON AND TIBC
Iron: 48 ug/dL (ref 28–170)
Saturation Ratios: 12 % (ref 10.4–31.8)
TIBC: 400 ug/dL (ref 250–450)
UIBC: 352 ug/dL

## 2023-04-09 LAB — FERRITIN: Ferritin: 14 ng/mL (ref 11–307)

## 2023-04-09 LAB — VITAMIN B12: Vitamin B-12: 959 pg/mL — ABNORMAL HIGH (ref 180–914)

## 2023-04-09 NOTE — Progress Notes (Signed)
Pathology report requested from [facility].

## 2023-04-09 NOTE — Progress Notes (Signed)
Has ct sch'd for Monday.

## 2023-04-10 ENCOUNTER — Encounter: Payer: Self-pay | Admitting: Internal Medicine

## 2023-04-10 DIAGNOSIS — C189 Malignant neoplasm of colon, unspecified: Secondary | ICD-10-CM | POA: Insufficient documentation

## 2023-04-10 DIAGNOSIS — D649 Anemia, unspecified: Secondary | ICD-10-CM | POA: Insufficient documentation

## 2023-04-10 NOTE — Progress Notes (Signed)
[facility] CONSULT NOTE  Patient Care Team: Leim Fabry, MD as PCP - General (Family Medicine) Michaelyn Barter, MD as Consulting Physician (Oncology) Benita Gutter, RN as Oncology Nurse Navigator  REFERRING PROVIDER: Dr. Clydene Pugh  REASON FOR REFFERAL: Cecal adenocarcinoma  CANCER STAGING   Cancer Staging  No matching staging information was found for the patient.   ASSESSMENT & PLAN:  Shari Melendez 62 y.o. 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  # Colon adenocarcinoma, MSI stable -Presented to PCP for IDA.  Colonoscopy showed infiltrative, sessile and ulcerated nonobstructing medium-sized mass in the cecum.  Measures 4 x 3 cm. -s/p biopsy showing moderately differentiated adenocarcinoma.  MMR stable -She is scheduled for CT chest abdomen pelvis on 04/13/2023 -She is scheduled with Dr. Maia Plan tomorrow to discuss about surgery considering CT chest is negative for metastatic disease.  Discussed depending on the staging, she may or may not need adjuvant chemotherapy which can vary from 3 to 6 months.  Patient would like to hold off on further discussion about chemo until completion of surgery.  # Iron deficiency anemia -Secondary to colon cancer -Continue with oral iron -Scheduled for IV Venofer 200 mg twice weekly x 5 doses -Repeat labs today.  Orders Placed This Encounter  Procedures   CBC with Differential/Platelet    Standing Status:   Future    Number of Occurrences:   1    Standing Expiration Date:   04/08/2024   Comprehensive metabolic panel    Standing Status:   Future    Number of Occurrences:   1    Standing Expiration Date:   04/08/2024   Iron and TIBC    Standing Status:   Future    Number of Occurrences:   1    Standing Expiration Date:   04/08/2024   Ferritin    Standing Status:   Future    Number of Occurrences:   1    Standing Expiration  Date:   04/08/2024   Vitamin B12    Standing Status:   Future    Number of Occurrences:   1    Standing Expiration Date:   04/08/2024   RTC in 4 weeks for MD visit to discuss pathology/adjuvant chemo postsurgery.  The total time spent in the appointment was 60 minutes encounter with patients including review of chart and various tests results, discussions about plan of care and coordination of care plan   All questions were answered. The patient knows to call the clinic with any problems, questions or concerns. No barriers to learning was detected.  Michaelyn Barter, MD 8/23/20243:34 PM   HISTORY OF PRESENTING ILLNESS:  Shari Melendez 62 y.o. 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  Patient was following with PCP for iron deficiency anemia which led to further workup and finding of large cecal mass.  She is accompanied today with her husband.  Feeling well overall.  Is asymptomatic.  Denies any bleeding in stools.  She is taking iron supplements.  Tolerating well.  I have reviewed her chart and materials related to her cancer extensively and collaborated history with the patient. Summary of oncologic history is as follows: Oncology History  Colon adenocarcinoma (HCC)  04/08/2023 Procedure   Endoscopy showed 2 cm hiatal hernia.  Colonoscopy showed infiltrative, sessile and ulcerated nonobstructing medium-sized mass in the cecum.  Partially circumferential.  Measures 4 x 3 cm.    Pathology Results          MEDICAL HISTORY:  History reviewed. No pertinent past medical history.  SURGICAL HISTORY: History reviewed. No pertinent surgical history.  SOCIAL HISTORY: Social History   Socioeconomic History   Marital status: Married    Spouse name: Not on file   Number of children: Not on file   Years of education: Not on file   Highest education level: Not on file  Occupational History    Not on file  Tobacco Use   Smoking status: Every Day    Current packs/day: 1.00    Average packs/day: 1 pack/day for 38.0 years (38.0 ttl pk-yrs)    Types: Cigarettes   Smokeless tobacco: Never  Vaping Use   Vaping status: Never Used  Substance and Sexual Activity   Alcohol use: No    Alcohol/week: 0.0 standard drinks of alcohol   Drug use: No   Sexual activity: Not on file  Other Topics Concern   Not on file  Social History Narrative   Not on file   Social Determinants of Health   Financial Resource Strain: Low Risk  (10/20/2022)   Received from [facility], [facility]   Overall Financial Resource Strain (CARDIA)    Difficulty of Paying Living Expenses: Not hard at all  Food Insecurity: No Food Insecurity (04/09/2023)   Hunger Vital Sign    Worried About Running Out of Food in the Last Year: Never true    Ran Out of Food in the Last Year: Never true  Transportation Needs: No Transportation Needs (04/09/2023)   PRAPARE - Administrator, Civil Service (Medical): No    Lack of Transportation (Non-Medical): No  Physical Activity: Not on file  Stress: Not on file  Social Connections: Not on file  Intimate Partner Violence: Not At Risk (04/09/2023)   Humiliation, Afraid, Rape, and Kick questionnaire    Fear of Current or Ex-Partner: No    Emotionally Abused: No    Physically Abused: No    Sexually Abused: No    FAMILY HISTORY: Family History  Problem Relation Age of Onset   Breast cancer Mother 5   Heart disease Father    Breast cancer Maternal Aunt 39    ALLERGIES:  is allergic to sulfa antibiotics.  MEDICATIONS:  Current Outpatient Medications  Medication Sig Dispense Refill   atorvastatin (LIPITOR) 40 MG tablet Take 40 mg by mouth at bedtime.     ferrous sulfate ER (SLOW FE) 142 (45 Fe) MG TBCR tablet Take by mouth.     Omega-3 Fatty Acids (FISH OIL) 1000 MG CAPS Take by mouth daily.     No current  facility-administered medications for this visit.    REVIEW OF SYSTEMS:   Pertinent information mentioned in HPI All other systems were reviewed with the patient and are negative.  PHYSICAL EXAMINATION: ECOG PERFORMANCE STATUS: 0 - Asymptomatic  Vitals:   04/09/23 1417  BP: (!) 145/71  Pulse: 69  Temp: 98.4 F (36.9 C)  SpO2: 96%   Filed Weights   04/09/23 1417  Weight: 120 lb 12.8 oz (54.8 kg)    GENERAL:alert, no distress and comfortable SKIN: skin color, texture, turgor are normal, no rashes or significant lesions EYES: normal, conjunctiva are pink and non-injected, sclera clear OROPHARYNX:no exudate, no erythema and lips, buccal mucosa, and tongue normal  NECK: supple, thyroid normal size, non-tender, without nodularity LYMPH:  no palpable lymphadenopathy in the cervical, axillary or inguinal LUNGS: clear to auscultation and percussion with normal breathing effort HEART: regular rate & rhythm and no murmurs and no lower extremity edema ABDOMEN:abdomen soft, non-tender and normal bowel sounds Musculoskeletal:no cyanosis of digits and no clubbing  PSYCH: alert & oriented x 3 with fluent speech NEURO: no focal motor/sensory deficits  LABORATORY DATA:  I have reviewed the data as listed Lab Results  Component Value Date   WBC 9.5 04/09/2023   HGB 11.0 (L) 04/09/2023   HCT 33.9 (L) 04/09/2023   MCV 90.6 04/09/2023   PLT 288 04/09/2023   Recent Labs    04/09/23 1512  NA 137  K 3.8  CL 103  CO2 26  GLUCOSE 102*  BUN 8  CREATININE 0.63  CALCIUM 9.0  GFRNONAA >60  PROT 7.7  ALBUMIN 4.2  AST 19  ALT 17  ALKPHOS 89  BILITOT 0.3    RADIOGRAPHIC STUDIES: I have personally reviewed the radiological images as listed and agreed with the findings in the report. No results found.

## 2023-04-13 ENCOUNTER — Ambulatory Visit
Admission: RE | Admit: 2023-04-13 | Discharge: 2023-04-13 | Disposition: A | Discharge status: Home / Self Care | Payer: No Typology Code available for payment source | Source: Ambulatory Visit | Attending: Gastroenterology | Admitting: Gastroenterology

## 2023-04-13 ENCOUNTER — Inpatient Hospital Stay: Payer: No Typology Code available for payment source

## 2023-04-13 VITALS — BP 120/66 | HR 86 | Temp 97.2°F | Resp 18

## 2023-04-13 DIAGNOSIS — D649 Anemia, unspecified: Secondary | ICD-10-CM

## 2023-04-13 DIAGNOSIS — C180 Malignant neoplasm of cecum: Secondary | ICD-10-CM | POA: Diagnosis not present

## 2023-04-13 DIAGNOSIS — C189 Malignant neoplasm of colon, unspecified: Secondary | ICD-10-CM

## 2023-04-13 MED ORDER — SODIUM CHLORIDE 0.9 % IV SOLN
200.0000 mg | Freq: Once | INTRAVENOUS | Status: AC
  Administered 2023-04-13: 200 mg via INTRAVENOUS
  Filled 2023-04-13: qty 200

## 2023-04-13 MED ORDER — SODIUM CHLORIDE 0.9 % IV SOLN
Freq: Once | INTRAVENOUS | Status: AC
  Filled 2023-04-13: qty 250

## 2023-04-13 MED ORDER — IOPAMIDOL (ISOVUE-370) INJECTION 76%
75.0000 mL | Freq: Once | INTRAVENOUS | Status: AC | PRN
  Administered 2023-04-13: 75 mL via INTRAVENOUS

## 2023-04-13 NOTE — Patient Instructions (Signed)
 Iron Sucrose Injection What is this medication? IRON SUCROSE (EYE ern SOO krose) treats low levels of iron (iron deficiency anemia) in people with kidney disease. Iron is a mineral that plays an important role in making red blood cells, which carry oxygen from your lungs to the rest of your body. This medicine may be used for other purposes; ask your health care provider or pharmacist if you have questions. COMMON BRAND NAME(S): Venofer What should I tell my care team before I take this medication? They need to know if you have any of these conditions: Anemia not caused by low iron levels Heart disease High levels of iron in the blood Kidney disease Liver disease An unusual or allergic reaction to iron, other medications, foods, dyes, or preservatives Pregnant or trying to get pregnant Breastfeeding How should I use this medication? This medication is for infusion into a vein. It is given in a hospital or clinic setting. Talk to your care team about the use of this medication in children. While this medication may be prescribed for children as young as 2 years for selected conditions, precautions do apply. Overdosage: If you think you have taken too much of this medicine contact a poison control center or emergency room at once. NOTE: This medicine is only for you. Do not share this medicine with others. What if I miss a dose? Keep appointments for follow-up doses. It is important not to miss your dose. Call your care team if you are unable to keep an appointment. What may interact with this medication? Do not take this medication with any of the following: Deferoxamine Dimercaprol Other iron products This medication may also interact with the following: Chloramphenicol Deferasirox This list may not describe all possible interactions. Give your health care provider a list of all the medicines, herbs, non-prescription drugs, or dietary supplements you use. Also tell them if you smoke,  drink alcohol, or use illegal drugs. Some items may interact with your medicine. What should I watch for while using this medication? Visit your care team regularly. Tell your care team if your symptoms do not start to get better or if they get worse. You may need blood work done while you are taking this medication. You may need to follow a special diet. Talk to your care team. Foods that contain iron include: whole grains/cereals, dried fruits, beans, or peas, leafy green vegetables, and organ meats (liver, kidney). What side effects may I notice from receiving this medication? Side effects that you should report to your care team as soon as possible: Allergic reactions--skin rash, itching, hives, swelling of the face, lips, tongue, or throat Low blood pressure--dizziness, feeling faint or lightheaded, blurry vision Shortness of breath Side effects that usually do not require medical attention (report to your care team if they continue or are bothersome): Flushing Headache Joint pain Muscle pain Nausea Pain, redness, or irritation at injection site This list may not describe all possible side effects. Call your doctor for medical advice about side effects. You may report side effects to FDA at 1-800-FDA-1088. Where should I keep my medication? This medication is given in a hospital or clinic. It will not be stored at home. NOTE: This sheet is a summary. It may not cover all possible information. If you have questions about this medicine, talk to your doctor, pharmacist, or health care provider.  2024 Elsevier/Gold Standard (2023-01-09 00:00:00)

## 2023-04-14 ENCOUNTER — Other Ambulatory Visit: Payer: Self-pay

## 2023-04-14 ENCOUNTER — Encounter: Payer: Self-pay | Admitting: General Surgery

## 2023-04-14 ENCOUNTER — Encounter
Admission: RE | Admit: 2023-04-14 | Discharge: 2023-04-14 | Disposition: A | Discharge status: Home / Self Care | Payer: No Typology Code available for payment source | Source: Ambulatory Visit | Attending: General Surgery | Admitting: General Surgery

## 2023-04-14 ENCOUNTER — Ambulatory Visit: Payer: Self-pay | Admitting: General Surgery

## 2023-04-14 DIAGNOSIS — C189 Malignant neoplasm of colon, unspecified: Secondary | ICD-10-CM

## 2023-04-14 DIAGNOSIS — Z01812 Encounter for preprocedural laboratory examination: Secondary | ICD-10-CM

## 2023-04-14 NOTE — Patient Instructions (Addendum)
Your procedure is scheduled on: 04/22/2023  Wednesday  Report to the Registration Desk on the 1st floor of the [facility]. To find out your arrival time, please call [phone] between 1PM - 3PM on: 9/3/ 2024 Tuesday  If your arrival time is 6:00 am, do not arrive before that time as the [facility] entrance doors do not open until 6:00 am.  REMEMBER: Instructions that are not followed completely may result in serious medical risk, up to and including death; or upon the discretion of your surgeon and anesthesiologist your surgery may need to be rescheduled.  Do not eat food after midnight the night before surgery.  No gum chewing or hard candies.    One week prior to surgery: Stop Anti-inflammatories (NSAIDS) such as Advil, Aleve, Ibuprofen, Motrin, Naproxen, Naprosyn and Aspirin based products such as Excedrin, Goody's Powder, BC Powder. Stop ANY OVER THE COUNTER supplements until after surgery. You may however, continue to take Tylenol if needed for pain up until the day of surgery.   No Alcohol for 24 hours before or after surgery.  No Smoking including e-cigarettes for 24 hours before surgery.  No chewable tobacco products for at least 6 hours before surgery.  No nicotine patches on the day of surgery.  Do not use any "recreational" drugs for at least a week (preferably 2 weeks) before your surgery.  Please be advised that the combination of cocaine and anesthesia may have negative outcomes, up to and including death. If you test positive for cocaine, your surgery will be cancelled.  On the morning of surgery brush your teeth with toothpaste and water, you may rinse your mouth with mouthwash if you wish. Do not swallow any toothpaste or mouthwash.  Use CHG Soap  as directed on instruction sheet.= provided for you  Do not wear jewelry, make-up, hairpins, clips or nail polish.  Do not wear lotions, powders, or perfumes.   Do not shave body hair from the neck down 48 hours  before surgery.  Contact lenses, hearing aids and dentures may not be worn into surgery.  Do not bring valuables to the hospital. [facility] is not responsible for any missing/lost belongings or valuables.    Notify your doctor if there is any change in your medical condition (cold, fever, infection).  Wear comfortable clothing (specific to your surgery type) to the hospital.  After surgery, you can help prevent lung complications by doing breathing exercises.  Take deep breaths and cough every 1-2 hours. Your doctor may order a device called an Incentive Spirometer to help you take deep breaths.  If you are being admitted to the hospital overnight, leave your suitcase in the car. After surgery it may be brought to your room.  In case of increased patient census, it may be necessary for you, the patient, to continue your postoperative care in the Same Day Surgery department.  If you are being discharged the day of surgery, you will not be allowed to drive home. You will need a responsible individual to drive you home and stay with you for 24 hours after surgery.    Please call the Pre-admissions Testing Dept. at [phone] if you have any questions about these instructions.  Surgery Visitation Policy:  Patients having surgery or a procedure may have two visitors.  Children under the age of 21 must have an adult with them who is not the patient.  Inpatient Visitation:    Visiting hours are 7 a.m. to 8 p.m. Up  to four visitors are allowed at one time in a patient room. The visitors may rotate out with other people during the day.  One visitor age 62 or older may stay with the patient overnight and must be in the room by 8 p.m.      Preparing for Surgery with CHLORHEXIDINE GLUCONATE (CHG) Soap  Chlorhexidine Gluconate (CHG) Soap  o An antiseptic cleaner that kills germs and bonds with the skin to continue killing germs even after washing  o Used for showering the night  before surgery and morning of surgery  Before surgery, you can play an important role by reducing the number of germs on your skin.  CHG (Chlorhexidine gluconate) soap is an antiseptic cleanser which kills germs and bonds with the skin to continue killing germs even after washing.  Please do not use if you have an allergy to CHG or antibacterial soaps. If your skin becomes reddened/irritated stop using the CHG.  1. Shower the NIGHT BEFORE SURGERY and the MORNING OF SURGERY with CHG soap.  2. If you choose to wash your hair, wash your hair first as usual with your normal shampoo.  3. After shampooing, rinse your hair and body thoroughly to remove the shampoo.  4. Use CHG as you would any other liquid soap. You can apply CHG directly to the skin and wash gently with a scrungie or a clean washcloth.  5. Apply the CHG soap to your body only from the neck down. Do not use on open wounds or open sores. Avoid contact with your eyes, ears, mouth, and genitals (private parts). Wash face and genitals (private parts) with your normal soap.  6. Wash thoroughly, paying special attention to the area where your surgery will be performed.  7. Thoroughly rinse your body with warm water.  8. Do not shower/wash with your normal soap after using and rinsing off the CHG soap.  9. Pat yourself dry with a clean towel.  10. Wear clean pajamas to bed the night before surgery.  12. Place clean sheets on your bed the night of your first shower and do not sleep with pets.  13. Shower again with the CHG soap on the day of surgery prior to arriving at the hospital.  14. Do not apply any deodorants/lotions/powders.  15. Please wear clean clothes to the hospital.

## 2023-04-16 ENCOUNTER — Inpatient Hospital Stay: Payer: No Typology Code available for payment source

## 2023-04-16 ENCOUNTER — Encounter
Admission: RE | Admit: 2023-04-16 | Discharge: 2023-04-16 | Disposition: A | Discharge status: Home / Self Care | Payer: No Typology Code available for payment source | Source: Ambulatory Visit | Attending: General Surgery | Admitting: General Surgery

## 2023-04-16 VITALS — BP 121/72 | HR 71 | Temp 98.2°F | Resp 16

## 2023-04-16 DIAGNOSIS — C189 Malignant neoplasm of colon, unspecified: Secondary | ICD-10-CM | POA: Diagnosis not present

## 2023-04-16 DIAGNOSIS — Z01812 Encounter for preprocedural laboratory examination: Secondary | ICD-10-CM | POA: Diagnosis not present

## 2023-04-16 DIAGNOSIS — C180 Malignant neoplasm of cecum: Secondary | ICD-10-CM | POA: Diagnosis not present

## 2023-04-16 DIAGNOSIS — D649 Anemia, unspecified: Secondary | ICD-10-CM

## 2023-04-16 LAB — TYPE AND SCREEN
ABO/RH(D): O POS
Antibody Screen: NEGATIVE

## 2023-04-16 MED ORDER — SODIUM CHLORIDE 0.9 % IV SOLN
200.0000 mg | Freq: Once | INTRAVENOUS | Status: AC
  Administered 2023-04-16: 200 mg via INTRAVENOUS
  Filled 2023-04-16: qty 200

## 2023-04-16 MED ORDER — SODIUM CHLORIDE 0.9 % IV SOLN
Freq: Once | INTRAVENOUS | Status: AC
  Filled 2023-04-16: qty 250

## 2023-04-22 ENCOUNTER — Other Ambulatory Visit: Payer: Self-pay

## 2023-04-22 ENCOUNTER — Encounter: Payer: Self-pay | Admitting: General Surgery

## 2023-04-22 ENCOUNTER — Inpatient Hospital Stay
Admission: RE | Admit: 2023-04-22 | Discharge: 2023-04-25 | DRG: 330 | Disposition: A | Discharge status: Home / Self Care | Payer: No Typology Code available for payment source | Attending: General Surgery | Admitting: General Surgery

## 2023-04-22 ENCOUNTER — Encounter
Admission: RE | Disposition: A | Discharge status: Home / Self Care | Payer: Self-pay | Source: Home / Self Care | Attending: General Surgery

## 2023-04-22 ENCOUNTER — Inpatient Hospital Stay: Payer: No Typology Code available for payment source | Admitting: Urgent Care

## 2023-04-22 DIAGNOSIS — C772 Secondary and unspecified malignant neoplasm of intra-abdominal lymph nodes: Secondary | ICD-10-CM | POA: Diagnosis present

## 2023-04-22 DIAGNOSIS — K769 Liver disease, unspecified: Secondary | ICD-10-CM

## 2023-04-22 DIAGNOSIS — E7800 Pure hypercholesterolemia, unspecified: Secondary | ICD-10-CM | POA: Diagnosis present

## 2023-04-22 DIAGNOSIS — I2510 Atherosclerotic heart disease of native coronary artery without angina pectoris: Secondary | ICD-10-CM | POA: Diagnosis present

## 2023-04-22 DIAGNOSIS — Z79899 Other long term (current) drug therapy: Secondary | ICD-10-CM

## 2023-04-22 DIAGNOSIS — Z882 Allergy status to sulfonamides status: Secondary | ICD-10-CM | POA: Diagnosis not present

## 2023-04-22 DIAGNOSIS — F17210 Nicotine dependence, cigarettes, uncomplicated: Secondary | ICD-10-CM | POA: Diagnosis present

## 2023-04-22 DIAGNOSIS — C787 Secondary malignant neoplasm of liver and intrahepatic bile duct: Secondary | ICD-10-CM | POA: Diagnosis present

## 2023-04-22 DIAGNOSIS — C182 Malignant neoplasm of ascending colon: Principal | ICD-10-CM | POA: Diagnosis present

## 2023-04-22 DIAGNOSIS — C189 Malignant neoplasm of colon, unspecified: Principal | ICD-10-CM | POA: Diagnosis present

## 2023-04-22 DIAGNOSIS — Z8249 Family history of ischemic heart disease and other diseases of the circulatory system: Secondary | ICD-10-CM

## 2023-04-22 HISTORY — DX: Carpal tunnel syndrome, unspecified upper limb: G56.00

## 2023-04-22 HISTORY — DX: Diaphragmatic hernia without obstruction or gangrene: K44.9

## 2023-04-22 HISTORY — DX: Atherosclerotic heart disease of native coronary artery without angina pectoris: I25.10

## 2023-04-22 HISTORY — DX: Nicotine dependence, unspecified, uncomplicated: F17.200

## 2023-04-22 HISTORY — DX: Iron deficiency anemia, unspecified: D50.9

## 2023-04-22 HISTORY — DX: Pure hypercholesterolemia, unspecified: E78.00

## 2023-04-22 HISTORY — DX: Atherosclerosis of aorta: I70.0

## 2023-04-22 LAB — ABO/RH: ABO/RH(D): O POS

## 2023-04-22 SURGERY — COLECTOMY, RIGHT, ROBOT-ASSISTED
Anesthesia: General | Site: Abdomen

## 2023-04-22 MED ORDER — HYDROCODONE-ACETAMINOPHEN 5-325 MG PO TABS: 1.0000 | ORAL_TABLET | ORAL | Status: DC | PRN

## 2023-04-22 MED ORDER — DIPHENHYDRAMINE HCL 50 MG/ML IJ SOLN
INTRAMUSCULAR | Status: DC | PRN
Start: 2023-04-22 — End: 2023-04-22
  Administered 2023-04-22: 25 mg via INTRAVENOUS

## 2023-04-22 MED ORDER — ACETAMINOPHEN 325 MG PO TABS: 650.0000 mg | ORAL_TABLET | Freq: Four times a day (QID) | ORAL | Status: DC | PRN

## 2023-04-22 MED ORDER — DEXAMETHASONE SODIUM PHOSPHATE 10 MG/ML IJ SOLN
INTRAMUSCULAR | Status: DC | PRN
  Administered 2023-04-22: 10 mg via INTRAVENOUS

## 2023-04-22 MED ORDER — ONDANSETRON HCL 4 MG/2ML IJ SOLN: 4.0000 mg | Freq: Once | INTRAMUSCULAR | Status: DC | PRN

## 2023-04-22 MED ORDER — ENOXAPARIN SODIUM 40 MG/0.4ML IJ SOSY
40.0000 mg | PREFILLED_SYRINGE | INTRAMUSCULAR | Status: DC
  Administered 2023-04-22 – 2023-04-24 (×3): 40 mg via SUBCUTANEOUS
  Filled 2023-04-22 (×3): qty 0.4

## 2023-04-22 MED ORDER — ALBUMIN HUMAN 5 % IV SOLN
INTRAVENOUS | Status: AC
  Filled 2023-04-22: qty 250

## 2023-04-22 MED ORDER — SUGAMMADEX SODIUM 200 MG/2ML IV SOLN
INTRAVENOUS | Status: DC | PRN
  Administered 2023-04-22: 200 mg via INTRAVENOUS

## 2023-04-22 MED ORDER — FAMOTIDINE 20 MG PO TABS
ORAL_TABLET | ORAL | Status: AC
  Filled 2023-04-22: qty 1

## 2023-04-22 MED ORDER — BUPIVACAINE HCL (PF) 0.5 % IJ SOLN
INTRAMUSCULAR | Status: AC
  Filled 2023-04-22: qty 30

## 2023-04-22 MED ORDER — ACETAMINOPHEN 500 MG PO TABS
1000.0000 mg | ORAL_TABLET | ORAL | Status: AC
  Administered 2023-04-22: 1000 mg via ORAL

## 2023-04-22 MED ORDER — FIBRIN SEALANT 2 ML SINGLE DOSE KIT
PACK | CUTANEOUS | Status: DC | PRN
Start: 2023-04-22 — End: 2023-04-22
  Administered 2023-04-22: 2 mL via TOPICAL

## 2023-04-22 MED ORDER — MIDAZOLAM HCL 2 MG/2ML IJ SOLN
INTRAMUSCULAR | Status: AC
  Filled 2023-04-22: qty 2

## 2023-04-22 MED ORDER — LIDOCAINE HCL (PF) 2 % IJ SOLN
INTRAMUSCULAR | Status: AC
  Filled 2023-04-22: qty 5

## 2023-04-22 MED ORDER — LACTATED RINGERS IV SOLN: INTRAVENOUS | Status: DC

## 2023-04-22 MED ORDER — FAMOTIDINE 20 MG PO TABS
20.0000 mg | ORAL_TABLET | Freq: Once | ORAL | Status: AC
  Administered 2023-04-22: 20 mg via ORAL

## 2023-04-22 MED ORDER — LACTATED RINGERS IV SOLN
INTRAVENOUS | Status: DC | PRN
Start: 2023-04-22 — End: 2023-04-22

## 2023-04-22 MED ORDER — PHENYLEPHRINE 80 MCG/ML (10ML) SYRINGE FOR IV PUSH (FOR BLOOD PRESSURE SUPPORT)
PREFILLED_SYRINGE | INTRAVENOUS | Status: AC
  Filled 2023-04-22: qty 10

## 2023-04-22 MED ORDER — GABAPENTIN 300 MG PO CAPS
ORAL_CAPSULE | ORAL | Status: AC
  Filled 2023-04-22: qty 1

## 2023-04-22 MED ORDER — MIDAZOLAM HCL 2 MG/2ML IJ SOLN
INTRAMUSCULAR | Status: DC | PRN
  Administered 2023-04-22 (×2): 1 mg via INTRAVENOUS

## 2023-04-22 MED ORDER — PROPOFOL 10 MG/ML IV BOLUS
INTRAVENOUS | Status: DC | PRN
  Administered 2023-04-22: 100 mg via INTRAVENOUS
  Administered 2023-04-22: 25 ug/kg/min via INTRAVENOUS

## 2023-04-22 MED ORDER — ALBUMIN HUMAN 5 % IV SOLN
INTRAVENOUS | Status: DC | PRN
Start: 2023-04-22 — End: 2023-04-22

## 2023-04-22 MED ORDER — OXYCODONE HCL 5 MG/5ML PO SOLN: 5.0000 mg | Freq: Once | ORAL | Status: DC | PRN

## 2023-04-22 MED ORDER — ACETAMINOPHEN 650 MG RE SUPP: 650.0000 mg | Freq: Four times a day (QID) | RECTAL | Status: DC | PRN

## 2023-04-22 MED ORDER — PHENYLEPHRINE HCL-NACL 20-0.9 MG/250ML-% IV SOLN
INTRAVENOUS | Status: AC
  Filled 2023-04-22: qty 250

## 2023-04-22 MED ORDER — GABAPENTIN 300 MG PO CAPS
300.0000 mg | ORAL_CAPSULE | ORAL | Status: AC
  Administered 2023-04-22: 300 mg via ORAL

## 2023-04-22 MED ORDER — CHLORHEXIDINE GLUCONATE 0.12 % MT SOLN
OROMUCOSAL | Status: AC
  Filled 2023-04-22: qty 15

## 2023-04-22 MED ORDER — ROCURONIUM BROMIDE 10 MG/ML (PF) SYRINGE
PREFILLED_SYRINGE | INTRAVENOUS | Status: AC
  Filled 2023-04-22: qty 10

## 2023-04-22 MED ORDER — LIDOCAINE HCL (CARDIAC) PF 100 MG/5ML IV SOSY
PREFILLED_SYRINGE | INTRAVENOUS | Status: DC | PRN
  Administered 2023-04-22: 50 mg via INTRAVENOUS

## 2023-04-22 MED ORDER — PROPOFOL 10 MG/ML IV BOLUS
INTRAVENOUS | Status: AC
  Filled 2023-04-22: qty 40

## 2023-04-22 MED ORDER — CHLORHEXIDINE GLUCONATE 0.12 % MT SOLN
15.0000 mL | Freq: Once | OROMUCOSAL | Status: AC
  Administered 2023-04-22: 15 mL via OROMUCOSAL

## 2023-04-22 MED ORDER — PANTOPRAZOLE SODIUM 40 MG IV SOLR
40.0000 mg | Freq: Every day | INTRAVENOUS | Status: DC
  Administered 2023-04-22 – 2023-04-23 (×2): 40 mg via INTRAVENOUS
  Filled 2023-04-22 (×2): qty 10

## 2023-04-22 MED ORDER — ONDANSETRON HCL 4 MG/2ML IJ SOLN
INTRAMUSCULAR | Status: AC
  Filled 2023-04-22: qty 2

## 2023-04-22 MED ORDER — FENTANYL CITRATE (PF) 100 MCG/2ML IJ SOLN
INTRAMUSCULAR | Status: AC
  Filled 2023-04-22: qty 2

## 2023-04-22 MED ORDER — BUPIVACAINE LIPOSOME 1.3 % IJ SUSP
INTRAMUSCULAR | Status: DC | PRN
  Administered 2023-04-22: 20 mL

## 2023-04-22 MED ORDER — ONDANSETRON HCL 4 MG/2ML IJ SOLN
4.0000 mg | Freq: Four times a day (QID) | INTRAMUSCULAR | Status: DC | PRN
  Administered 2023-04-22 (×2): 4 mg via INTRAVENOUS
  Filled 2023-04-22 (×2): qty 2

## 2023-04-22 MED ORDER — BUPIVACAINE LIPOSOME 1.3 % IJ SUSP
20.0000 mL | Freq: Once | INTRAMUSCULAR | Status: DC
  Administered 2023-04-22: 266 mg

## 2023-04-22 MED ORDER — BUPIVACAINE LIPOSOME 1.3 % IJ SUSP
INTRAMUSCULAR | Status: AC
  Filled 2023-04-22: qty 20

## 2023-04-22 MED ORDER — ACETAMINOPHEN 10 MG/ML IV SOLN: 1000.0000 mg | Freq: Once | INTRAVENOUS | Status: DC | PRN

## 2023-04-22 MED ORDER — SODIUM CHLORIDE 0.9 % IV SOLN: INTRAVENOUS | Status: DC

## 2023-04-22 MED ORDER — MORPHINE SULFATE (PF) 4 MG/ML IV SOLN: 4.0000 mg | INTRAVENOUS | Status: DC | PRN

## 2023-04-22 MED ORDER — SODIUM CHLORIDE 0.9 % IV SOLN
2.0000 g | INTRAVENOUS | Status: AC
  Administered 2023-04-22: 2 g via INTRAVENOUS
  Filled 2023-04-22: qty 2

## 2023-04-22 MED ORDER — FENTANYL CITRATE (PF) 100 MCG/2ML IJ SOLN
25.0000 ug | INTRAMUSCULAR | Status: DC | PRN
  Administered 2023-04-22: 25 ug via INTRAVENOUS

## 2023-04-22 MED ORDER — EPHEDRINE SULFATE (PRESSORS) 50 MG/ML IJ SOLN
INTRAMUSCULAR | Status: DC | PRN
  Administered 2023-04-22: 10 mg via INTRAVENOUS

## 2023-04-22 MED ORDER — ACETAMINOPHEN 500 MG PO TABS
ORAL_TABLET | ORAL | Status: AC
  Filled 2023-04-22: qty 2

## 2023-04-22 MED ORDER — ONDANSETRON HCL 4 MG/2ML IJ SOLN
INTRAMUSCULAR | Status: DC | PRN
  Administered 2023-04-22: 4 mg via INTRAVENOUS

## 2023-04-22 MED ORDER — DEXAMETHASONE SODIUM PHOSPHATE 10 MG/ML IJ SOLN
INTRAMUSCULAR | Status: AC
  Filled 2023-04-22: qty 1

## 2023-04-22 MED ORDER — OXYCODONE HCL 5 MG PO TABS: 5.0000 mg | ORAL_TABLET | Freq: Once | ORAL | Status: DC | PRN

## 2023-04-22 MED ORDER — FENTANYL CITRATE (PF) 100 MCG/2ML IJ SOLN
INTRAMUSCULAR | Status: DC | PRN
  Administered 2023-04-22 (×2): 50 ug via INTRAVENOUS

## 2023-04-22 MED ORDER — PHENYLEPHRINE HCL-NACL 20-0.9 MG/250ML-% IV SOLN
INTRAVENOUS | Status: DC | PRN
Start: 2023-04-22 — End: 2023-04-22
  Administered 2023-04-22: 20 ug/min via INTRAVENOUS

## 2023-04-22 MED ORDER — ALVIMOPAN 12 MG PO CAPS: 12.0000 mg | ORAL_CAPSULE | ORAL | Status: AC

## 2023-04-22 MED ORDER — EPHEDRINE 5 MG/ML INJ
INTRAVENOUS | Status: AC
  Filled 2023-04-22: qty 5

## 2023-04-22 MED ORDER — ROCURONIUM BROMIDE 100 MG/10ML IV SOLN
INTRAVENOUS | Status: DC | PRN
  Administered 2023-04-22: 5 mg via INTRAVENOUS
  Administered 2023-04-22: 35 mg via INTRAVENOUS
  Administered 2023-04-22: 10 mg via INTRAVENOUS
  Administered 2023-04-22: 20 mg via INTRAVENOUS

## 2023-04-22 MED ORDER — ORAL CARE MOUTH RINSE: 15.0000 mL | Freq: Once | OROMUCOSAL | Status: AC

## 2023-04-22 MED ORDER — ALVIMOPAN 12 MG PO CAPS
12.0000 mg | ORAL_CAPSULE | ORAL | Status: AC
  Administered 2023-04-22: 12 mg via ORAL

## 2023-04-22 MED ORDER — ALVIMOPAN 12 MG PO CAPS
ORAL_CAPSULE | ORAL | Status: AC
  Filled 2023-04-22: qty 1

## 2023-04-22 MED ORDER — BUPIVACAINE HCL (PF) 0.5 % IJ SOLN
INTRAMUSCULAR | Status: DC | PRN
  Administered 2023-04-22: 30 mL

## 2023-04-22 MED ORDER — ONDANSETRON 4 MG PO TBDP: 4.0000 mg | ORAL_TABLET | Freq: Four times a day (QID) | ORAL | Status: DC | PRN

## 2023-04-22 MED ORDER — INDOCYANINE GREEN 25 MG IV SOLR
INTRAVENOUS | Status: DC | PRN
Start: 2023-04-22 — End: 2023-04-22
  Administered 2023-04-22 (×3): 5 mg via INTRAVENOUS

## 2023-04-22 MED ORDER — FIBRIN SEALANT 2 ML SINGLE DOSE KIT
PACK | CUTANEOUS | Status: AC
  Filled 2023-04-22: qty 2

## 2023-04-22 MED ORDER — PHENYLEPHRINE HCL (PRESSORS) 10 MG/ML IV SOLN
INTRAVENOUS | Status: DC | PRN
  Administered 2023-04-22 (×3): 80 ug via INTRAVENOUS

## 2023-04-22 SURGICAL SUPPLY — 84 items
ADH SKN CLS APL DERMABOND .7 (GAUZE/BANDAGES/DRESSINGS) ×3
APL LAPSCP 35 DL APL RGD (MISCELLANEOUS) ×1
APPLICATOR VISTASEAL 35 (MISCELLANEOUS) IMPLANT
BLADE CLIPPER SURG (BLADE) ×1 IMPLANT
BLADE SURG SZ10 CARB STEEL (BLADE) ×1 IMPLANT
BLADE SURG SZ11 CARB STEEL (BLADE) ×1 IMPLANT
CANNULA REDUCER 12-8 DVNC XI (CANNULA) ×1 IMPLANT
CLIP LIGATING HEMO O LOK GREEN (MISCELLANEOUS) ×1 IMPLANT
COVER TIP SHEARS 8 DVNC (MISCELLANEOUS) ×1 IMPLANT
DERMABOND ADVANCED .7 DNX12 (GAUZE/BANDAGES/DRESSINGS) ×1 IMPLANT
DRAPE ARM DVNC X/XI (DISPOSABLE) ×4 IMPLANT
DRAPE COLUMN DVNC XI (DISPOSABLE) ×1 IMPLANT
DRSG OPSITE POSTOP 4X10 (GAUZE/BANDAGES/DRESSINGS) IMPLANT
DRSG OPSITE POSTOP 4X8 (GAUZE/BANDAGES/DRESSINGS) IMPLANT
DRSG TELFA 3X4 N-ADH STERILE (GAUZE/BANDAGES/DRESSINGS) IMPLANT
ELECT BLADE 6.5 EXT (BLADE) IMPLANT
ELECT CAUTERY BLADE 6.4 (BLADE) IMPLANT
ELECT REM PT RETURN 9FT ADLT (ELECTROSURGICAL) ×1
ELECTRODE REM PT RTRN 9FT ADLT (ELECTROSURGICAL) ×1 IMPLANT
FORCEPS BPLR R/ABLATION 8 DVNC (INSTRUMENTS) ×1 IMPLANT
GLOVE BIO SURGEON STRL SZ 6.5 (GLOVE) ×3 IMPLANT
GLOVE BIOGEL PI IND STRL 6.5 (GLOVE) ×3 IMPLANT
GOWN STRL REUS W/ TWL LRG LVL3 (GOWN DISPOSABLE) ×6 IMPLANT
GOWN STRL REUS W/TWL LRG LVL3 (GOWN DISPOSABLE) ×6
GRASPER TIP-UP FEN DVNC XI (INSTRUMENTS) ×1 IMPLANT
HANDLE YANKAUER SUCT BULB TIP (MISCELLANEOUS) ×1 IMPLANT
IRRIGATION STRYKERFLOW (MISCELLANEOUS) IMPLANT
IRRIGATOR STRYKERFLOW (MISCELLANEOUS)
IRRIGATOR SUCT 8 DISP DVNC XI (IRRIGATION / IRRIGATOR) IMPLANT
IV NS 1000ML (IV SOLUTION)
IV NS 1000ML BAXH (IV SOLUTION) IMPLANT
KIT IMAGING PINPOINTPAQ (MISCELLANEOUS) IMPLANT
KIT PINK PAD W/HEAD ARE REST (MISCELLANEOUS) ×1
KIT PINK PAD W/HEAD ARM REST (MISCELLANEOUS) ×1 IMPLANT
LABEL OR SOLS (LABEL) IMPLANT
MANIFOLD NEPTUNE II (INSTRUMENTS) ×1 IMPLANT
NDL DRIVE SUT CUT DVNC (INSTRUMENTS) ×1 IMPLANT
NDL HYPO 22X1.5 SAFETY MO (MISCELLANEOUS) ×1 IMPLANT
NDL INSUFFLATION 14GA 120MM (NEEDLE) ×1 IMPLANT
NEEDLE DRIVE SUT CUT DVNC (INSTRUMENTS) ×1 IMPLANT
NEEDLE HYPO 22X1.5 SAFETY MO (MISCELLANEOUS) ×1 IMPLANT
NEEDLE INSUFFLATION 14GA 120MM (NEEDLE) ×1 IMPLANT
OBTURATOR OPTICAL STND 8 DVNC (TROCAR) ×1
OBTURATOR OPTICALSTD 8 DVNC (TROCAR) ×1 IMPLANT
PACK COLON CLEAN CLOSURE (MISCELLANEOUS) ×1 IMPLANT
PACK LAP CHOLECYSTECTOMY (MISCELLANEOUS) ×1 IMPLANT
PORT ACCESS TROCAR AIRSEAL 5 (TROCAR) ×1 IMPLANT
RELOAD STAPLE 45 3.5 BLU DVNC (STAPLE) IMPLANT
RELOAD STAPLE 60 2.5 WHT DVNC (STAPLE) IMPLANT
RELOAD STAPLE 60 3.5 BLU DVNC (STAPLE) IMPLANT
RELOAD STAPLER 2.5X60 WHT DVNC (STAPLE) ×1 IMPLANT
RELOAD STAPLER 3.5X45 BLU DVNC (STAPLE) IMPLANT
RELOAD STAPLER 3.5X60 BLU DVNC (STAPLE) ×2 IMPLANT
RETRACTOR WOUND ALXS 18CM SML (MISCELLANEOUS) IMPLANT
RTRCTR WOUND ALEXIS O 18CM SML (MISCELLANEOUS) ×1
SCISSORS MNPLR CVD DVNC XI (INSTRUMENTS) ×1 IMPLANT
SEAL UNIV 5-12 XI (MISCELLANEOUS) ×3 IMPLANT
SEALER VESSEL EXT DVNC XI (MISCELLANEOUS) IMPLANT
SET TRI-LUMEN FLTR TB AIRSEAL (TUBING) ×1 IMPLANT
SOL ELECTROSURG ANTI STICK (MISCELLANEOUS) ×1
SOLUTION ELECTROSURG ANTI STCK (MISCELLANEOUS) ×1 IMPLANT
SPONGE T-LAP 18X18 ~~LOC~~+RFID (SPONGE) ×1 IMPLANT
SPONGE T-LAP 4X18 ~~LOC~~+RFID (SPONGE) ×1 IMPLANT
STAPLER 45 SUREFORM DVNC (STAPLE) IMPLANT
STAPLER 60 SUREFORM DVNC (STAPLE) IMPLANT
STAPLER RELOAD 2.5X60 WHT DVNC (STAPLE) ×1
STAPLER RELOAD 3.5X45 BLU DVNC (STAPLE)
STAPLER RELOAD 3.5X60 BLU DVNC (STAPLE) ×2
SUT MNCRL 4-0 (SUTURE) ×1
SUT MNCRL 4-0 27XMFL (SUTURE) ×1
SUT SILK 3 0 SH 30 (SUTURE) IMPLANT
SUT STRATAFIX 0 PDS+ CT-2 23 (SUTURE) ×1
SUT V-LOC 90 ABS 3-0 VLT V-20 (SUTURE) ×1 IMPLANT
SUT VIC AB 3-0 SH 27 (SUTURE) ×1
SUT VIC AB 3-0 SH 27X BRD (SUTURE) ×1 IMPLANT
SUT VICRYL 0 UR6 27IN ABS (SUTURE) IMPLANT
SUTURE MNCRL 4-0 27XMF (SUTURE) ×2 IMPLANT
SUTURE STRATFX 0 PDS+ CT-2 23 (SUTURE) ×1 IMPLANT
SYR 30ML LL (SYRINGE) ×1 IMPLANT
SYS BAG RETRIEVAL 10MM (BASKET) ×1
SYSTEM BAG RETRIEVAL 10MM (BASKET) IMPLANT
TRAP FLUID SMOKE EVACUATOR (MISCELLANEOUS) ×1 IMPLANT
TRAY FOLEY MTR SLVR 16FR STAT (SET/KITS/TRAYS/PACK) ×1 IMPLANT
WATER STERILE IRR 500ML POUR (IV SOLUTION) ×1 IMPLANT

## 2023-04-22 NOTE — Anesthesia Postprocedure Evaluation (Signed)
Anesthesia Post Note  Patient: Shari Melendez  Procedure(s) Performed: XI ROBOT ASSISTED RIGHT COLECTOMY (Abdomen)  Patient location during evaluation: PACU Anesthesia Type: General Level of consciousness: awake and alert, oriented and patient cooperative Pain management: pain level controlled Vital Signs Assessment: post-procedure vital signs reviewed and stable Respiratory status: spontaneous breathing, nonlabored ventilation and respiratory function stable Cardiovascular status: blood pressure returned to baseline and stable Postop Assessment: adequate PO intake Anesthetic complications: no   No notable events documented.   Last Vitals:  Vitals:   04/22/23 1430 04/22/23 1445  BP: 115/61 (!) 141/58  Pulse: 75 67  Resp: 15 15  Temp:    SpO2: 100% 100%    Last Pain:  Vitals:   04/22/23 1445  TempSrc:   PainSc: 0-No pain                 Reed Breech

## 2023-04-22 NOTE — Anesthesia Preprocedure Evaluation (Addendum)
Anesthesia Evaluation  Patient identified by MRN, date of birth, ID band Patient awake    Reviewed: Allergy & Precautions, NPO status , Patient's Chart, lab work & pertinent test results  History of Anesthesia Complications Negative for: history of anesthetic complications  Airway Mallampati: II   Neck ROM: Full    Dental no notable dental hx.    Pulmonary Current Smoker (4 cigarettes per day) and Patient abstained from smoking.   Pulmonary exam normal breath sounds clear to auscultation       Cardiovascular Exercise Tolerance: Good Normal cardiovascular exam Rhythm:Regular Rate:Normal  Echo 02/08/20:  Normal Stress Echocardiogram  NORMAL RIGHT VENTRICULAR SYSTOLIC FUNCTION  TRIVIAL REGURGITATION NOTED  NO VALVULAR STENOSIS NOTED  Resting EF: >55% (Est.)  Post Stress EF: >55% (Est.)  ECG Results: Normal  Mitral: TRIVIAL MR  Tricuspid: TRIVIAL TR    Neuro/Psych negative neurological ROS     GI/Hepatic hiatal hernia,,,  Endo/Other  negative endocrine ROS    Renal/GU negative Renal ROS     Musculoskeletal   Abdominal   Peds  Hematology  (+) Blood dyscrasia, anemia   Anesthesia Other Findings   Reproductive/Obstetrics                             Anesthesia Physical Anesthesia Plan  ASA: 2  Anesthesia Plan: General   Post-op Pain Management:    Induction: Intravenous  PONV Risk Score and Plan: 2 and Ondansetron, Dexamethasone and Treatment may vary due to age or medical condition  Airway Management Planned: Oral ETT  Additional Equipment:   Intra-op Plan:   Post-operative Plan: Extubation in OR  Informed Consent: I have reviewed the patients History and Physical, chart, labs and discussed the procedure including the risks, benefits and alternatives for the proposed anesthesia with the patient or authorized representative who has indicated his/her understanding and  acceptance.     Dental advisory given  Plan Discussed with: CRNA  Anesthesia Plan Comments: (Patient consented for risks of anesthesia including but not limited to:  - adverse reactions to medications - damage to eyes, teeth, lips or other oral mucosa - nerve damage due to positioning  - sore throat or hoarseness - damage to heart, brain, nerves, lungs, other parts of body or loss of life  Informed patient about role of CRNA in peri- and intra-operative care.  Patient voiced understanding.)        Anesthesia Quick Evaluation

## 2023-04-22 NOTE — Anesthesia Procedure Notes (Signed)
Procedure Name: Intubation Date/Time: 04/22/2023 10:21 AM  Performed by: Elisabeth Pigeon, CRNAPre-anesthesia Checklist: Patient identified, Patient being monitored, Timeout performed, Emergency Drugs available and Suction available Patient Re-evaluated:Patient Re-evaluated prior to induction Oxygen Delivery Method: Circle system utilized Preoxygenation: Pre-oxygenation with 100% oxygen Induction Type: IV induction Ventilation: Mask ventilation without difficulty Laryngoscope Size: Mac and 3 Grade View: Grade I Tube type: Oral Tube size: 6.5 mm Number of attempts: 1 Airway Equipment and Method: Stylet Placement Confirmation: ETT inserted through vocal cords under direct vision, positive ETCO2 and breath sounds checked- equal and bilateral Secured at: 19 cm Tube secured with: Tape Dental Injury: Teeth and Oropharynx as per pre-operative assessment

## 2023-04-22 NOTE — Op Note (Signed)
Preoperative diagnosis: Right colon cancer  Postoperative diagnosis: Right colon cancer  Procedure: Robotic assisted laparoscopic right colectomy.   Anesthesia: GETA   Surgeon: Carolan Shiver, MD  Assistant: None    Wound Classification: clean contaminated   Specimen: Right colon   Complications: None   Estimated Blood Loss: 50 mL   Indications: Patient is a 62 y.o. female with symptoms of rectal bleeding was found to have carcinoma involving the ascending colon. Resection was indicated for oncologic treatment.   FIndings: 1.  Ascending colon mass with tattoo 2.  Adequate hemostasis.  3.  Large liver mass identified and biopsy taken   Description of procedure: The patient was placed on the operating table in the supine position, both arms tucked. General anesthesia was induced. A time-out was completed verifying correct patient, procedure, site, positioning, and implant(s) and/or special equipment prior to beginning this procedure. The abdomen was prepped and draped in the usual sterile fashion.    Veress needle was inserted in the supraumbilical point.  Gas insufflation was initiated until the abdominal pressure was measured at 15 mmHg.  A small incision was done in the left upper quadrant.  Access to the abdominal cavity was done in the Optiview fashion.  Afterwards, 3 additional incision was made 5 cm apart along the left side of the abdominal wall from the initial incision and two additional 8mm port and one 12 mm port were placed under direct visualization. 5 mm assistant port was then placed between 2 of the robotic ports.  No injuries from trocar placements were noted. Exparel infused on the bilateral abdominal wall. The table was placed in the reverse Trendelenburg position with the right side elevated.  Xi robotic platform was then brought to the operative field and docked at an angle from the left lower quadrant.  Tip up grasper and scissors was placed in right arm  ports.  Fenestrated bipolar in left arm port.   Examination of the abdominal cavity noted a large liver mass. Frozen biopsy taken but was reported as inflammation. Large resection of liver biopsy was done for better sampling.  The ascending colon with inking was noted.  I elevated the cecum and identified the ileocolic pedicle.  This was dissected circumferentially and divided with vessel sealer.  Medial to lateral dissection was started until the right ureter was identified.  Then dissection was continued in a medial to lateral fashion in the cephalad way until the duodenum was identified in the right upper quadrant.  The duodenum was carefully dissected down.  The dissection continued in a more lateral until the hepatic flexure was identified.  Then attention was on the terminal ileum.  The mesentery of the terminal ileum was dissected off to the bowel.  Then the point of transection of the transverse colon was identified.  The mesentery of the transverse colon was divided with vessel sealer being very careful with the duodenum.  Once the two-point of dissection were identified, 5 mg of ICG green were administered.  This was done to be able to identify the perfusion of the colonic flap pedicles.  Adequate ICG perfusion was identified.  The rest of the mesentery was divided with vessel sealer.  Then the ileum and the transverse colon was divided with linear stapler.   The transected specimen was placed atop the liver.  The small bowel was then brought towards the transverse colon and a isoperistaltic anastomosis was created.  Enterotomies were made in the small bowel and the transverse colon, small bowel  enterotomy created 2 cm from the staple line and transverse colon enterotomy made 8 cm from the staple line.  60 mm blue load stapler placed through these enterotomies and new anastomosis created.  The enterotomy was then closed by placing 2 anchor sutures at the 2 apexes using 2-0 silk, then running 3-0 V-Lock  in a 2 layer fashion.    No bleeding or additional pathology was noted. Robot was then undocked, and the remaining port sites were removed, the abdomen was allowed to collapse.  A periumbilical incision was done. The specimen was able to be removed out of the abdomen completely.     Using a clean closure technique, the fascia was closed with 0 STRATAFIX.  All skin incisions then closed with subcuticular sutures Monocryl 4-0.  Wounds then dressed with dermabond.   The patient tolerated the procedure well, awakened from anesthesia and was taken to the postanesthesia care unit in satisfactory condition.  Foley still in place.  Sponge count and instrument count correct at the end of the procedure.

## 2023-04-22 NOTE — Transfer of Care (Signed)
Immediate Anesthesia Transfer of Care Note  Patient: Shari Melendez  Procedure(s) Performed: XI ROBOT ASSISTED RIGHT COLECTOMY (Abdomen)  Patient Location: PACU  Anesthesia Type:General  Level of Consciousness: drowsy  Airway & Oxygen Therapy: Patient Spontanous Breathing and Patient connected to face mask oxygen  Post-op Assessment: Report given to RN and Post -op Vital signs reviewed and stable  Post vital signs: Reviewed and stable  Last Vitals:  Vitals Value Taken Time  BP 124/57 04/22/23 1422  Temp    Pulse 71 04/22/23 1424  Resp 17 04/22/23 1424  SpO2 100 % 04/22/23 1424  Vitals shown include unfiled device data.  Last Pain:  Vitals:   04/22/23 0901  TempSrc: Temporal  PainSc: 0-No pain         Complications: No notable events documented.

## 2023-04-22 NOTE — H&P (Signed)
PATIENT PROFILE: Shari Melendez is a 62 y.o. female who presents to the Clinic for consultation at the request of Clydene Pugh, [place] for evaluation of malignant neoplasm of ascending colon.  PCP: Kandice Robinsons, MD  HISTORY OF PRESENT ILLNESS: [name] reports that she was being evaluated due to iron deficiency anemia. She was referred to gastroenterology for further evaluation of iron deficiency anemia. Patient denies abdominal pain. Patient denies change in bowel habits. Patient denies gross blood in stool. Patient denies unintentional weight loss. Upon workup for iron deficiency anemia she had a colonoscopy that showed a mass in the ascending large intestine. Biopsy of the mass was taken and showed moderately differentiated adenocarcinoma. I personally evaluated the images of the colonoscopy. Previous colonoscopy on 05/11/2014 negative for polyps or any other pathologies.  PROBLEM LIST: Problem List Date Reviewed: 01/27/2023   Noted  Hypercholesterolemia 12/26/2019  ASCVD (arteriosclerotic cardiovascular disease) 09/26/2019  Overview  Incident finding on lung CT screen - coronary and Aorta   Tobacco use 04/20/2018  Overview  Current smoker 1 pack for 38 years   Triggering of digit 12/07/2015  Carpal tunnel syndrome on both sides Unknown  Trigger finger of both hands Unknown   GENERAL REVIEW OF SYSTEMS:   General ROS: negative for - chills, fatigue, fever, weight gain or weight loss Allergy and Immunology ROS: negative for - hives  Hematological and Lymphatic ROS: negative for - bleeding problems or bruising, negative for palpable nodes Endocrine ROS: negative for - heat or cold intolerance, hair changes Respiratory ROS: negative for - cough, shortness of breath or wheezing Cardiovascular ROS: no chest pain or palpitations GI ROS: negative for nausea, vomiting, abdominal pain, diarrhea, constipation Musculoskeletal ROS: negative for - joint swelling or muscle  pain Neurological ROS: negative for - confusion, syncope Dermatological ROS: negative for pruritus and rash Psychiatric: negative for anxiety, depression, difficulty sleeping and memory loss  MEDICATIONS: Current Outpatient Medications  Medication Sig Dispense Refill  atorvastatin (LIPITOR) 40 MG tablet Take 1 tablet (40 mg total) by mouth at bedtime 90 tablet 3  cyanocobalamin (VITAMIN B12) 1000 MCG tablet Take 1,000 mcg by mouth once daily  docosahexaenoic acid/epa (FISH OIL ORAL) Take by mouth  ferrous sulfate (SLOW FE) 137 mg (45 mg iron) TbER Take 137 mg by mouth 2 (two) times daily 60 tablet 11  sodium, potassium, and magnesium (SUPREP) oral solution Take 1 Bottle by mouth as directed One kit contains 2 bottles. Take both bottles at the times instructed by your provider. 354 mL 0  vit A/vit C/vit E/zinc/copper (PRESERVISION AREDS ORAL) Take by mouth   No current facility-administered medications for this visit.   ALLERGIES: Sulfa (sulfonamide antibiotics)  PAST MEDICAL HISTORY: Past Medical History:  Diagnosis Date  Carpal tunnel syndrome on both sides  Tobacco use 04/20/2018  Current smoker 1 pack for 38 years  Trigger finger of both hands  s/p surgical repair   PAST SURGICAL HISTORY: Past Surgical History:  Procedure Laterality Date  Colon @ [facility] 04/03/2023  moderately differentiated adenocarcinoma/  EGD @ [facility] 04/03/2023  Normal EGD biopsy  COLONOSCOPY 05/11/2014 [facility]  Normal/Repeat 73yrs/[facility]  INCISION TENDON SHEATH FOR TRIGGER FINGER  bilateral 3rd fingers    FAMILY HISTORY: Family History  Problem Relation Name Age of Onset  High blood pressure (Hypertension) Mother  High blood pressure (Hypertension) Father  Myocardial Infarction (Heart attack) Father  Coronary Artery Disease (Blocked arteries around heart) Father  Depression Daughter  Depression Daughter    SOCIAL HISTORY: Social History  Socioeconomic History  Marital status: Married  Tobacco Use   Smoking status: Every Day  Current packs/day: 0.50  Average packs/day: 0.5 packs/day for 33.0 years (16.5 ttl pk-yrs)  Types: Cigarettes  Smokeless tobacco: Never  Substance and Sexual Activity  Alcohol use: No  Drug use: No  Sexual activity: Yes  Partners: Male  Social History Narrative  Thayer Ohm is her husband, smokes ciggs. Herbert Seta is her daughter. Has another daughter [place]. 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   Social Determinants of Health   Financial Resource Strain: Low Risk (10/20/2022)  Overall Financial Resource Strain (CARDIA)  Difficulty of Paying Living Expenses: Not hard at all  Food Insecurity: No Food Insecurity (04/09/2023)  [facility] from [facility]  Hunger Vital Sign  Worried About Running Out of Food in the Last Year: Never true  Ran Out of Food in the Last Year: Never true  Transportation Needs: No Transportation Needs (04/09/2023)  Received from [facility] - Transportation  Lack of Transportation (Medical): No  Lack of Transportation (Non-Medical): No   PHYSICAL EXAM: There were no vitals filed for this visit. Body mass index is 20.3 kg/m. Weight: 55.3 kg (122 lb)   GENERAL: Alert, active, oriented x3  HEENT: Pupils equal reactive to light. Extraocular movements are intact. Sclera clear. Palpebral conjunctiva normal red color.Pharynx clear.  NECK: Supple with no palpable mass and no adenopathy.  LUNGS: Sound clear with no rales rhonchi or wheezes.  HEART: Regular rhythm S1 and S2 without murmur.  ABDOMEN: Soft and depressible, nontender with no palpable mass, no hepatomegaly.   EXTREMITIES: Well-developed well-nourished symmetrical with no dependent edema.  NEUROLOGICAL: Awake alert oriented, facial expression symmetrical, moving all extremities.  REVIEW OF DATA: I have reviewed the following data today: Initial consult on 02/24/2023  Component Date Value  WBC (White Blood Cell Co* 02/24/2023 8.3  RBC (Red Blood Cell Coun* 02/24/2023  3.52 (L)  Hemoglobin 02/24/2023 10.5 (L)  Hematocrit 02/24/2023 32.1 (L)  MCV (Mean Corpuscular Vo* 02/24/2023 91.2  MCH (Mean Corpuscular He* 02/24/2023 29.8  MCHC (Mean Corpuscular H* 02/24/2023 32.7  Platelet Count 02/24/2023 296  RDW-CV (Red Cell Distrib* 02/24/2023 16.0 (H)  MPV (Mean Platelet Volum* 02/24/2023 9.7  Neutrophils 02/24/2023 5.33  Lymphocytes 02/24/2023 1.93  Monocytes 02/24/2023 0.79  Eosinophils 02/24/2023 0.17  Basophils 02/24/2023 0.08  Neutrophil % 02/24/2023 64.1  Lymphocyte % 02/24/2023 23.2  Monocyte % 02/24/2023 9.5  Eosinophil % 02/24/2023 2.0  Basophil% 02/24/2023 1.0  Immature Granulocyte % 02/24/2023 0.2  Immature Granulocyte Cou* 02/24/2023 0.02  Office Visit on 01/26/2023  Component Date Value  WBC (White Blood Cell Co* 01/26/2023 9.1  Hemoglobin 01/26/2023 9.0 (L)  Hematocrit 01/26/2023 29.5 (L)  Platelets 01/26/2023 333  MCV (Mean Corpuscular Vo* 01/26/2023 91  MCH (Mean Corpuscular He* 01/26/2023 27.7  MCHC (Mean Corpuscular H* 01/26/2023 30.5 (L)  RBC (Red Blood Cell Coun* 01/26/2023 3.25 (L)  RDW-CV (Red Cell Distrib* 01/26/2023 14.6 (H)  NRBC (Nucleated Red Bloo* 01/26/2023 0.00  NRBC % (Nucleated Red Bl* 01/26/2023 0.0  MPV (Mean Platelet Volum* 01/26/2023 10.9  Neutrophil Count 01/26/2023 5.5  Neutrophil % 01/26/2023 59.9  Lymphocyte Count 01/26/2023 2.4  Lymphocyte % 01/26/2023 26.8  Monocyte Count 01/26/2023 0.9  Monocyte % 01/26/2023 10.0  Eosinophil Count 01/26/2023 0.16  Eosinophil % 01/26/2023 1.8  Basophil Count 01/26/2023 0.10  Basophil % 01/26/2023 1.1  Immature Granulocyte Cou* 01/26/2023 0.04  Immature Granulocyte % 01/26/2023 0.4  Iron 01/26/2023 18 (L)  Total Iron Binding Capac* 01/26/2023  435  Percent Transferrin Satu* 01/26/2023 4 (L)  Ferritin 01/26/2023 5 (L)    ASSESSMENT: Ms. Ramanathan is a 62 y.o. female presenting for consultation for malignant neoplasm of the ascending colon.  Patient again oriented  about the biopsy results of the colonoscopy showed moderately differentiated adenocarcinoma. Initially presented as iron deficiency anemia. CT scan of the chest abdomen and pelvis scheduled for 04/13/2023. Will order CEA levels to have a baseline. Patient evaluated by medical oncology yesterday.  We discussed about the surgical management of malignant neoplasm of the ascending large intestine. We discussed about right hemicolectomy. We discussed about minimally invasive approach. We discussed about the risks of surgery including bleeding, infection, leak of anastomosis, bowel obstruction, injury to adjacent organs such as small intestine, kidney, ureter, bladder, liver, bladder, stomach, among others. We also discussed about the risk of anesthesia such as stroke, cardiac umbilication, pneumonia, DVT, PE, among others.   Malignant neoplasm of ascending colon (CMS/HHS-HCC) [C18.2]  PLAN: Robotic assisted laparoscopic right hemicolectomy (40981)   Patient and her husband verbalized understanding, all questions were answered, and were agreeable with the plan outlined above.   Carolan Shiver, MD

## 2023-04-23 LAB — BASIC METABOLIC PANEL
Anion gap: 9 (ref 5–15)
BUN: 11 mg/dL (ref 8–23)
CO2: 23 mmol/L (ref 22–32)
Calcium: 8.1 mg/dL — ABNORMAL LOW (ref 8.9–10.3)
Chloride: 103 mmol/L (ref 98–111)
Creatinine, Ser: 0.68 mg/dL (ref 0.44–1.00)
GFR, Estimated: >60 mL/min (ref >60)
Glucose, Bld: 102 mg/dL — ABNORMAL HIGH (ref 70–99)
Potassium: 3.5 mmol/L (ref 3.5–5.1)
Sodium: 135 mmol/L (ref 135–145)

## 2023-04-23 LAB — CBC
HCT: 27.6 % — ABNORMAL LOW (ref 36.0–46.0)
Hemoglobin: 9.4 g/dL — ABNORMAL LOW (ref 12.0–15.0)
MCH: 30.4 pg (ref 26.0–34.0)
MCHC: 34.1 g/dL (ref 30.0–36.0)
MCV: 89.3 fL (ref 80.0–100.0)
Platelets: 255 10*3/uL (ref 150–400)
RBC: 3.09 MIL/uL — ABNORMAL LOW (ref 3.87–5.11)
RDW: 15.1 % (ref 11.5–15.5)
WBC: 14.1 10*3/uL — ABNORMAL HIGH (ref 4.0–10.5)
nRBC: 0 % (ref 0.0–0.2)

## 2023-04-23 LAB — MAGNESIUM: Magnesium: 1.7 mg/dL (ref 1.7–2.4)

## 2023-04-23 LAB — PHOSPHORUS: Phosphorus: 3.9 mg/dL (ref 2.5–4.6)

## 2023-04-23 NOTE — Progress Notes (Addendum)
Patient ID: Shari Melendez, female   DOB: [birth year], 62 y.o.   MRN: [id number]     SURGICAL PROGRESS NOTE   Hospital Day(s): 1.   Interval History: Patient seen and examined, no acute events or new complaints overnight. Patient reports feeling sore.  She endorses that the pain is controlled but still sore in the surgical area.  She is still not passing gas or having bowel movement.  She was able to ambulate today.  No nausea or vomiting.  Vital signs in last 24 hours: [min-max] current  Temp:  [98.6 F (37 C)-99.1 F (37.3 C)] 99 F (37.2 C) (09/05 1509) Pulse Rate:  [69-81] 73 (09/05 1509) Resp:  [18-20] 18 (09/05 1509) BP: (120-137)/(57-65) 129/60 (09/05 1509) SpO2:  [96 %-99 %] 96 % (09/05 1509)     Height: 5\' 4"  (162.6 cm) Weight: 54.8 kg BMI (Calculated): 20.73   Physical Exam:  Constitutional: alert, cooperative and no distress  Respiratory: breathing non-labored at rest  Cardiovascular: regular rate and sinus rhythm  Gastrointestinal: soft, non-tender, and non-distended  Labs:     Latest Ref Rng & Units 04/23/2023    5:00 AM 04/09/2023    3:12 PM  CBC  WBC 4.0 - 10.5 K/uL 14.1  9.5   Hemoglobin 12.0 - 15.0 g/dL 9.4  25.4   Hematocrit 36.0 - 46.0 % 27.6  33.9   Platelets 150 - 400 K/uL 255  288       Latest Ref Rng & Units 04/23/2023    5:00 AM 04/09/2023    3:12 PM  CMP  Glucose 70 - 99 mg/dL 270  623   BUN 8 - 23 mg/dL 11  8   Creatinine 7.62 - 1.00 mg/dL 8.31  5.17   Sodium 616 - 145 mmol/L 135  137   Potassium 3.5 - 5.1 mmol/L 3.5  3.8   Chloride 98 - 111 mmol/L 103  103   CO2 22 - 32 mmol/L 23  26   Calcium 8.9 - 10.3 mg/dL 8.1  9.0   Total Protein 6.5 - 8.1 g/dL  7.7   Total Bilirubin 0.3 - 1.2 mg/dL  0.3   Alkaline Phos 38 - 126 U/L  89   AST 15 - 41 U/L  19   ALT 0 - 44 U/L  17     Imaging studies: No new pertinent imaging studies   Assessment/Plan:  62 y.o. female with malignant neoplasm of the ascending colon 1 Day Post-Op s/p robotic assisted  laparoscopic right hemicolectomy.  Patient is doing well.  Stable vital signs. -Adequate labs, adequate urine output.  Will discontinue Foley today -Still not passing gas or any sign of bowel function.  Will continue with Entereg -Continue pain management -Encourage patient to ambulate  Gae Gallop, MD

## 2023-04-23 NOTE — Plan of Care (Signed)

## 2023-04-23 NOTE — TOC CM/SW Note (Signed)
Transition of Care [facility]) - Inpatient Brief Assessment   Patient Details  Name: Shari Melendez MRN: [id number] Date of Birth: [birth year]  Transition of Care [facility]) CM/SW Contact:    Chapman Fitch, RN Phone Number: 04/23/2023, 2:44 PM   Clinical Narrative:   Transition of Care [facility]) Screening Note   Patient Details  Name: Shari Melendez Date of Birth: March 16, 1961   Transition of Care [facility]) CM/SW Contact:    Chapman Fitch, RN Phone Number: 04/23/2023, 2:44 PM    Transition of Care Department [facility]) has reviewed patient and no TOC needs have been identified at this time. We will continue to monitor patient advancement through interdisciplinary progression rounds. If new patient transition needs arise, please place a TOC consult.    Transition of Care Asessment: Insurance and Status: Insurance coverage has been reviewed Patient has primary care physician: Yes     Prior/Current Home Services: No current home services Social Determinants of Health Reivew: SDOH reviewed no interventions necessary Readmission risk has been reviewed: Yes Transition of care needs: no transition of care needs at this time

## 2023-04-24 ENCOUNTER — Inpatient Hospital Stay: Payer: No Typology Code available for payment source

## 2023-04-24 MED ORDER — PANTOPRAZOLE SODIUM 40 MG PO TBEC
40.0000 mg | DELAYED_RELEASE_TABLET | Freq: Every day | ORAL | Status: DC
  Administered 2023-04-24 – 2023-04-25 (×2): 40 mg via ORAL
  Filled 2023-04-24 (×2): qty 1

## 2023-04-24 MED ORDER — POLYETHYLENE GLYCOL 3350 17 G PO PACK
17.0000 g | PACK | Freq: Every day | ORAL | Status: DC
  Administered 2023-04-25: 17 g via ORAL
  Filled 2023-04-24 (×2): qty 1

## 2023-04-24 NOTE — Progress Notes (Signed)
Patient ID: Shari Melendez, female   DOB: 03-Dec-1960, 62 y.o.   MRN: [id number]     SURGICAL PROGRESS NOTE   Hospital Day(s): 2.   Interval History: Patient seen and examined, no acute events or new complaints overnight. Patient reports feeling better this morning.  She endorsed that she had a good night.  She denies any nausea or vomiting.  She is still not passing gas.    Vital signs in last 24 hours: [min-max] current  Temp:  [98.6 F (37 C)-99.6 F (37.6 C)] 99.6 F (37.6 C) (09/05 1912) Pulse Rate:  [69-74] 74 (09/05 1912) Resp:  [18-20] 20 (09/05 1912) BP: (123-134)/(58-62) 134/62 (09/05 1912) SpO2:  [96 %-97 %] 96 % (09/05 1912)     Height: 5\' 4"  (162.6 cm) Weight: 54.8 kg BMI (Calculated): 20.73   Physical Exam:  Constitutional: alert, cooperative and no distress  Respiratory: breathing non-labored at rest  Cardiovascular: regular rate and sinus rhythm  Gastrointestinal: soft, non-tender, and non-distended  Labs:     Latest Ref Rng & Units 04/23/2023    5:00 AM 04/09/2023    3:12 PM  CBC  WBC 4.0 - 10.5 K/uL 14.1  9.5   Hemoglobin 12.0 - 15.0 g/dL 9.4  40.9   Hematocrit 36.0 - 46.0 % 27.6  33.9   Platelets 150 - 400 K/uL 255  288       Latest Ref Rng & Units 04/23/2023    5:00 AM 04/09/2023    3:12 PM  CMP  Glucose 70 - 99 mg/dL 811  914   BUN 8 - 23 mg/dL 11  8   Creatinine 7.82 - 1.00 mg/dL 9.56  2.13   Sodium 086 - 145 mmol/L 135  137   Potassium 3.5 - 5.1 mmol/L 3.5  3.8   Chloride 98 - 111 mmol/L 103  103   CO2 22 - 32 mmol/L 23  26   Calcium 8.9 - 10.3 mg/dL 8.1  9.0   Total Protein 6.5 - 8.1 g/dL  7.7   Total Bilirubin 0.3 - 1.2 mg/dL  0.3   Alkaline Phos 38 - 126 U/L  89   AST 15 - 41 U/L  19   ALT 0 - 44 U/L  17     Imaging studies: No new pertinent imaging studies   Assessment/Plan:  62 y.o. female with malignant neoplasm of the ascending colon 2 Day Post-Op s/p robotic assisted laparoscopic right hemicolectomy.   -Patient continue without  clinical deterioration.  There is no fever.  No tachycardia. -Abdominal pain improving.  No nausea or vomiting. -Still not passing gas.  Will continue with Entereg -Will add MiraLAX -Encouraged the patient to ambulate  Gae Gallop, MD

## 2023-04-24 NOTE — Plan of Care (Signed)

## 2023-04-24 NOTE — Plan of Care (Signed)
Shari Melendez

## 2023-04-25 MED ORDER — ONDANSETRON HCL 4 MG PO TABS: 4.0000 mg | ORAL_TABLET | Freq: Every day | ORAL | 1 refills | Status: AC | PRN

## 2023-04-25 MED ORDER — OXYCODONE-ACETAMINOPHEN 5-325 MG PO TABS
1.0000 | ORAL_TABLET | ORAL | 0 refills | Status: AC | PRN
Start: 2023-04-25 — End: 2024-04-24

## 2023-04-25 NOTE — Discharge Summary (Signed)
  Patient ID: Shari Melendez MRN: [id number] DOB/AGE: 09-11-1960 62 y.o.  Admit date: 04/22/2023 Discharge date: 04/25/2023   Discharge Diagnoses:  Principal Problem:   Colon cancer [facility])   Procedures:Robotic assisted laparoscopic right hemicolectomy  Hospital Course: Patient admitted for treatment of malignant neoplasm of the ascending colon. She underwent robotic assisted laparoscopic right hemicolectomy. Intra op finding of liver mass. Biopsy was done. She has been recovering well. Pain controlled. Patient had bowel movements. Tolerating soft diet. Ambulating. Pending final pathology.  Physical Exam Vitals reviewed.  HENT:     Head: Normocephalic.  Cardiovascular:     Rate and Rhythm: Normal rate and regular rhythm.  Pulmonary:     Effort: Pulmonary effort is normal.     Breath sounds: Normal breath sounds.  Abdominal:     General: Abdomen is flat. Bowel sounds are normal. There is no distension.     Tenderness: There is no abdominal tenderness.  Musculoskeletal:     Cervical back: Normal range of motion.  Skin:    Capillary Refill: Capillary refill takes less than 2 seconds.  Neurological:     Mental Status: She is alert and oriented to person, place, and time.      Consults: None  Disposition: Discharge disposition: 01-Home or Self Care       Discharge Instructions     Diet - low sodium heart healthy   Complete by: As directed    Increase activity slowly   Complete by: As directed       Allergies as of 04/25/2023       Reactions   Sulfa Antibiotics Other (See Comments)   Other Reaction: SWELLING AND HIVES        Medication List     TAKE these medications    atorvastatin 40 MG tablet Commonly known as: LIPITOR Take 40 mg by mouth at bedtime.   Fish Oil 1000 MG Caps Take by mouth daily.   ondansetron 4 MG tablet Commonly known as: Zofran Take 1 tablet (4 mg total) by mouth daily as needed for nausea or vomiting.   oxyCODONE-acetaminophen  5-325 MG tablet Commonly known as: Percocet Take 1 tablet by mouth every 4 (four) hours as needed for severe pain.   Slow Fe 142 (45 Fe) MG Tbcr tablet Generic drug: ferrous sulfate ER Take by mouth.        Follow-up Information     Carolan Shiver, MD Follow up in 2 week(s).   Specialty: General Surgery Contact information: [address] [place] [address] [phone]

## 2023-04-25 NOTE — Progress Notes (Signed)
Pt discharged to home. DC instructions given with female family member at bedside. No concerns voiced. Pt left unit in wheelchair pushed by Nehemiah Settle, NT accompanied by female family member. Left in stable condition.

## 2023-04-25 NOTE — Discharge Instructions (Signed)

## 2023-04-25 NOTE — Plan of Care (Signed)

## 2023-04-27 ENCOUNTER — Inpatient Hospital Stay: Payer: No Typology Code available for payment source | Attending: Internal Medicine

## 2023-04-27 VITALS — BP 135/59 | HR 72 | Temp 99.3°F | Resp 18

## 2023-04-27 DIAGNOSIS — Z803 Family history of malignant neoplasm of breast: Secondary | ICD-10-CM | POA: Diagnosis not present

## 2023-04-27 DIAGNOSIS — C180 Malignant neoplasm of cecum: Secondary | ICD-10-CM | POA: Insufficient documentation

## 2023-04-27 DIAGNOSIS — F17210 Nicotine dependence, cigarettes, uncomplicated: Secondary | ICD-10-CM | POA: Diagnosis not present

## 2023-04-27 DIAGNOSIS — Z9049 Acquired absence of other specified parts of digestive tract: Secondary | ICD-10-CM | POA: Diagnosis not present

## 2023-04-27 DIAGNOSIS — C787 Secondary malignant neoplasm of liver and intrahepatic bile duct: Secondary | ICD-10-CM | POA: Diagnosis not present

## 2023-04-27 DIAGNOSIS — C189 Malignant neoplasm of colon, unspecified: Secondary | ICD-10-CM

## 2023-04-27 DIAGNOSIS — D508 Other iron deficiency anemias: Secondary | ICD-10-CM | POA: Insufficient documentation

## 2023-04-27 DIAGNOSIS — D649 Anemia, unspecified: Secondary | ICD-10-CM

## 2023-04-27 MED ORDER — SODIUM CHLORIDE 0.9 % IV SOLN
Freq: Once | INTRAVENOUS | Status: AC
  Filled 2023-04-27: qty 250

## 2023-04-27 MED ORDER — SODIUM CHLORIDE 0.9 % IV SOLN
200.0000 mg | Freq: Once | INTRAVENOUS | Status: AC
  Administered 2023-04-27: 200 mg via INTRAVENOUS
  Filled 2023-04-27: qty 200

## 2023-04-27 NOTE — Patient Instructions (Signed)
Iron Sucrose Injection What is this medication? IRON SUCROSE (EYE ern SOO krose) treats low levels of iron (iron deficiency anemia) in people with kidney disease. Iron is a mineral that plays an important role in making red blood cells, which carry oxygen from your lungs to the rest of your body. This medicine may be used for other purposes; ask your health care provider or pharmacist if you have questions. COMMON BRAND NAME(S): Venofer What should I tell my care team before I take this medication? They need to know if you have any of these conditions: Anemia not caused by low iron levels Heart disease High levels of iron in the blood Kidney disease Liver disease An unusual or allergic reaction to iron, other medications, foods, dyes, or preservatives Pregnant or trying to get pregnant Breastfeeding How should I use this medication? This medication is for infusion into a vein. It is given in a hospital or clinic setting. Talk to your care team about the use of this medication in children. While this medication may be prescribed for children as young as 2 years for selected conditions, precautions do apply. Overdosage: If you think you have taken too much of this medicine contact a poison control center or emergency room at once. NOTE: This medicine is only for you. Do not share this medicine with others. What if I miss a dose? Keep appointments for follow-up doses. It is important not to miss your dose. Call your care team if you are unable to keep an appointment. What may interact with this medication? Do not take this medication with any of the following: Deferoxamine Dimercaprol Other iron products This medication may also interact with the following: Chloramphenicol Deferasirox This list may not describe all possible interactions. Give your health care provider a list of all the medicines, herbs, non-prescription drugs, or dietary supplements you use. Also tell them if you smoke,  drink alcohol, or use illegal drugs. Some items may interact with your medicine. What should I watch for while using this medication? Visit your care team regularly. Tell your care team if your symptoms do not start to get better or if they get worse. You may need blood work done while you are taking this medication. You may need to follow a special diet. Talk to your care team. Foods that contain iron include: whole grains/cereals, dried fruits, beans, or peas, leafy green vegetables, and organ meats (liver, kidney). What side effects may I notice from receiving this medication? Side effects that you should report to your care team as soon as possible: Allergic reactions--skin rash, itching, hives, swelling of the face, lips, tongue, or throat Low blood pressure--dizziness, feeling faint or lightheaded, blurry vision Shortness of breath Side effects that usually do not require medical attention (report to your care team if they continue or are bothersome): Flushing Headache Joint pain Muscle pain Nausea Pain, redness, or irritation at injection site This list may not describe all possible side effects. Call your doctor for medical advice about side effects. You may report side effects to FDA at 1-800-FDA-1088. Where should I keep my medication? This medication is given in a hospital or clinic. It will not be stored at home. NOTE: This sheet is a summary. It may not cover all possible information. If you have questions about this medicine, talk to your doctor, pharmacist, or health care provider.  2024 Elsevier/Gold Standard (2023-01-09 00:00:00)

## 2023-04-27 NOTE — Group Note (Deleted)

## 2023-04-28 LAB — SURGICAL PATHOLOGY

## 2023-04-30 ENCOUNTER — Inpatient Hospital Stay: Payer: No Typology Code available for payment source

## 2023-04-30 VITALS — BP 133/56 | HR 66 | Temp 98.6°F | Resp 16

## 2023-04-30 DIAGNOSIS — D508 Other iron deficiency anemias: Secondary | ICD-10-CM | POA: Diagnosis not present

## 2023-04-30 DIAGNOSIS — D649 Anemia, unspecified: Secondary | ICD-10-CM

## 2023-04-30 DIAGNOSIS — C189 Malignant neoplasm of colon, unspecified: Secondary | ICD-10-CM

## 2023-04-30 MED ORDER — SODIUM CHLORIDE 0.9 % IV SOLN
200.0000 mg | Freq: Once | INTRAVENOUS | Status: AC
  Administered 2023-04-30: 200 mg via INTRAVENOUS
  Filled 2023-04-30: qty 200

## 2023-04-30 MED ORDER — SODIUM CHLORIDE 0.9 % IV SOLN
Freq: Once | INTRAVENOUS | Status: AC
  Filled 2023-04-30: qty 250

## 2023-04-30 NOTE — Progress Notes (Signed)
Pt declined 30 min waiting period post venofer infusion.  Vitals stable.  Pt left infusion suite stable and ambulatory

## 2023-04-30 NOTE — Patient Instructions (Signed)
Iron Sucrose Injection What is this medication? IRON SUCROSE (EYE ern SOO krose) treats low levels of iron (iron deficiency anemia) in people with kidney disease. Iron is a mineral that plays an important role in making red blood cells, which carry oxygen from your lungs to the rest of your body. This medicine may be used for other purposes; ask your health care provider or pharmacist if you have questions. COMMON BRAND NAME(S): Venofer What should I tell my care team before I take this medication? They need to know if you have any of these conditions: Anemia not caused by low iron levels Heart disease High levels of iron in the blood Kidney disease Liver disease An unusual or allergic reaction to iron, other medications, foods, dyes, or preservatives Pregnant or trying to get pregnant Breastfeeding How should I use this medication? This medication is for infusion into a vein. It is given in a hospital or clinic setting. Talk to your care team about the use of this medication in children. While this medication may be prescribed for children as young as 2 years for selected conditions, precautions do apply. Overdosage: If you think you have taken too much of this medicine contact a poison control center or emergency room at once. NOTE: This medicine is only for you. Do not share this medicine with others. What if I miss a dose? Keep appointments for follow-up doses. It is important not to miss your dose. Call your care team if you are unable to keep an appointment. What may interact with this medication? Do not take this medication with any of the following: Deferoxamine Dimercaprol Other iron products This medication may also interact with the following: Chloramphenicol Deferasirox This list may not describe all possible interactions. Give your health care provider a list of all the medicines, herbs, non-prescription drugs, or dietary supplements you use. Also tell them if you smoke,  drink alcohol, or use illegal drugs. Some items may interact with your medicine. What should I watch for while using this medication? Visit your care team regularly. Tell your care team if your symptoms do not start to get better or if they get worse. You may need blood work done while you are taking this medication. You may need to follow a special diet. Talk to your care team. Foods that contain iron include: whole grains/cereals, dried fruits, beans, or peas, leafy green vegetables, and organ meats (liver, kidney). What side effects may I notice from receiving this medication? Side effects that you should report to your care team as soon as possible: Allergic reactions--skin rash, itching, hives, swelling of the face, lips, tongue, or throat Low blood pressure--dizziness, feeling faint or lightheaded, blurry vision Shortness of breath Side effects that usually do not require medical attention (report to your care team if they continue or are bothersome): Flushing Headache Joint pain Muscle pain Nausea Pain, redness, or irritation at injection site This list may not describe all possible side effects. Call your doctor for medical advice about side effects. You may report side effects to FDA at 1-800-FDA-1088. Where should I keep my medication? This medication is given in a hospital or clinic. It will not be stored at home. NOTE: This sheet is a summary. It may not cover all possible information. If you have questions about this medicine, talk to your doctor, pharmacist, or health care provider.  2024 Elsevier/Gold Standard (2023-01-09 00:00:00)

## 2023-05-04 ENCOUNTER — Inpatient Hospital Stay: Payer: No Typology Code available for payment source

## 2023-05-04 VITALS — BP 130/60 | HR 70 | Temp 96.9°F

## 2023-05-04 DIAGNOSIS — D649 Anemia, unspecified: Secondary | ICD-10-CM

## 2023-05-04 DIAGNOSIS — C189 Malignant neoplasm of colon, unspecified: Secondary | ICD-10-CM

## 2023-05-04 DIAGNOSIS — D508 Other iron deficiency anemias: Secondary | ICD-10-CM | POA: Diagnosis not present

## 2023-05-04 MED ORDER — SODIUM CHLORIDE 0.9 % IV SOLN
Freq: Once | INTRAVENOUS | Status: AC
  Filled 2023-05-04: qty 250

## 2023-05-04 MED ORDER — SODIUM CHLORIDE 0.9 % IV SOLN
200.0000 mg | Freq: Once | INTRAVENOUS | Status: AC
  Administered 2023-05-04: 200 mg via INTRAVENOUS
  Filled 2023-05-04: qty 200

## 2023-05-04 NOTE — Progress Notes (Signed)
Patient declined to wait the 30 minutes for post iron infusion observation today. Tolerated infusion well. VSS.

## 2023-05-06 ENCOUNTER — Ambulatory Visit
Admission: RE | Admit: 2023-05-06 | Discharge: 2023-05-06 | Disposition: A | Discharge status: Home / Self Care | Payer: No Typology Code available for payment source | Source: Ambulatory Visit | Attending: Internal Medicine | Admitting: Internal Medicine

## 2023-05-06 DIAGNOSIS — K769 Liver disease, unspecified: Secondary | ICD-10-CM | POA: Insufficient documentation

## 2023-05-06 MED ORDER — GADOBUTROL 1 MMOL/ML IV SOLN
5.0000 mL | Freq: Once | INTRAVENOUS | Status: AC | PRN
  Administered 2023-05-06: 5 mL via INTRAVENOUS

## 2023-05-07 ENCOUNTER — Ambulatory Visit: Payer: No Typology Code available for payment source | Admitting: Internal Medicine

## 2023-05-07 ENCOUNTER — Other Ambulatory Visit: Payer: No Typology Code available for payment source

## 2023-05-14 ENCOUNTER — Ambulatory Visit: Payer: No Typology Code available for payment source | Admitting: Hospice and Palliative Medicine

## 2023-05-14 ENCOUNTER — Other Ambulatory Visit: Payer: No Typology Code available for payment source

## 2023-05-15 ENCOUNTER — Other Ambulatory Visit: Payer: Self-pay | Admitting: *Deleted

## 2023-05-15 DIAGNOSIS — D649 Anemia, unspecified: Secondary | ICD-10-CM

## 2023-05-15 DIAGNOSIS — C189 Malignant neoplasm of colon, unspecified: Secondary | ICD-10-CM

## 2023-05-18 ENCOUNTER — Encounter: Payer: Self-pay | Admitting: Internal Medicine

## 2023-05-18 ENCOUNTER — Inpatient Hospital Stay: Payer: No Typology Code available for payment source

## 2023-05-18 ENCOUNTER — Inpatient Hospital Stay (HOSPITAL_BASED_OUTPATIENT_CLINIC_OR_DEPARTMENT_OTHER): Payer: No Typology Code available for payment source | Admitting: Internal Medicine

## 2023-05-18 VITALS — BP 137/64 | HR 62 | Temp 97.7°F | Wt 116.8 lb

## 2023-05-18 DIAGNOSIS — D500 Iron deficiency anemia secondary to blood loss (chronic): Secondary | ICD-10-CM

## 2023-05-18 DIAGNOSIS — C787 Secondary malignant neoplasm of liver and intrahepatic bile duct: Secondary | ICD-10-CM | POA: Diagnosis not present

## 2023-05-18 DIAGNOSIS — C189 Malignant neoplasm of colon, unspecified: Secondary | ICD-10-CM | POA: Diagnosis not present

## 2023-05-18 DIAGNOSIS — D508 Other iron deficiency anemias: Secondary | ICD-10-CM | POA: Diagnosis not present

## 2023-05-18 DIAGNOSIS — D649 Anemia, unspecified: Secondary | ICD-10-CM

## 2023-05-18 LAB — CBC WITH DIFFERENTIAL/PLATELET
Abs Immature Granulocytes: 0.03 10*3/uL (ref 0.00–0.07)
Basophils Absolute: 0.1 10*3/uL (ref 0.0–0.1)
Basophils Relative: 1 %
Eosinophils Absolute: 0.1 10*3/uL (ref 0.0–0.5)
Eosinophils Relative: 2 %
HCT: 34.8 % — ABNORMAL LOW (ref 36.0–46.0)
Hemoglobin: 11.2 g/dL — ABNORMAL LOW (ref 12.0–15.0)
Immature Granulocytes: 0 %
Lymphocytes Relative: 26 %
Lymphs Abs: 1.9 10*3/uL (ref 0.7–4.0)
MCH: 30.2 pg (ref 26.0–34.0)
MCHC: 32.2 g/dL (ref 30.0–36.0)
MCV: 93.8 fL (ref 80.0–100.0)
Monocytes Absolute: 0.5 10*3/uL (ref 0.1–1.0)
Monocytes Relative: 7 %
Neutro Abs: 4.9 10*3/uL (ref 1.7–7.7)
Neutrophils Relative %: 64 %
Platelets: 291 10*3/uL (ref 150–400)
RBC: 3.71 MIL/uL — ABNORMAL LOW (ref 3.87–5.11)
RDW: 14.2 % (ref 11.5–15.5)
WBC: 7.5 10*3/uL (ref 4.0–10.5)
nRBC: 0 % (ref 0.0–0.2)

## 2023-05-18 LAB — CMP (CANCER CENTER ONLY)
ALT: 18 U/L (ref 0–44)
AST: 18 U/L (ref 15–41)
Albumin: 4 g/dL (ref 3.5–5.0)
Alkaline Phosphatase: 89 U/L (ref 38–126)
Anion gap: 8 (ref 5–15)
BUN: 10 mg/dL (ref 8–23)
CO2: 28 mmol/L (ref 22–32)
Calcium: 9 mg/dL (ref 8.9–10.3)
Chloride: 101 mmol/L (ref 98–111)
Creatinine: 0.65 mg/dL (ref 0.44–1.00)
GFR, Estimated: >60 mL/min (ref >60)
Glucose, Bld: 99 mg/dL (ref 70–99)
Potassium: 4.2 mmol/L (ref 3.5–5.1)
Sodium: 137 mmol/L (ref 135–145)
Total Bilirubin: 0.7 mg/dL (ref 0.3–1.2)
Total Protein: 7.4 g/dL (ref 6.5–8.1)

## 2023-05-18 LAB — IRON AND TIBC
Iron: 74 ug/dL (ref 28–170)
Saturation Ratios: 23 % (ref 10.4–31.8)
TIBC: 318 ug/dL (ref 250–450)
UIBC: 244 ug/dL

## 2023-05-18 LAB — FERRITIN: Ferritin: 177 ng/mL (ref 11–307)

## 2023-05-18 NOTE — Progress Notes (Signed)
Patient had an MRI on 05/06/2023.

## 2023-05-18 NOTE — Progress Notes (Signed)
START ON PATHWAY REGIMEN - Colorectal     A cycle is every 14 days:     Oxaliplatin      Leucovorin      Fluorouracil      Fluorouracil   **Always confirm dose/schedule in your pharmacy ordering system**  Patient Characteristics: Distant Metastases, Postoperative Treatment for R0 Resection Tumor Location: Colon Therapeutic Status: Distant Metastases  Intent of Therapy: Curative Intent, Discussed with Patient

## 2023-05-18 NOTE — Progress Notes (Signed)
Colorectal - No Medical Intervention - Off Treatment.  Patient Characteristics: Distant Metastases, Resectable, Neoadjuvant Therapy Not Planned Tumor Location: Colon Therapeutic Status: Distant Metastases

## 2023-05-18 NOTE — Progress Notes (Signed)
[name] CONSULT NOTE  Patient Care Team: Leim Fabry, MD as PCP - General (Family Medicine) Michaelyn Barter, MD as Consulting Physician (Oncology) Benita Gutter, RN as Oncology Nurse Navigator   REASON FOR REFFERAL: Cecal adenocarcinoma  CANCER STAGING   Cancer Staging  Colon adenocarcinoma [facility]) Staging form: Colon and Rectum, AJCC 8th Edition - Pathologic: pT3, pN2b, pM1 - Signed by Michaelyn Barter, MD on 05/18/2023 Stage prefix: Initial diagnosis Total positive nodes: 7 Total nodes examined: 60 Histologic grading system: 4 grade system Histologic grade (G): G2   ASSESSMENT & PLAN:  Shari Melendez 62 y.o. 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  # Colon adenocarcinoma, MSI stable -Presented to PCP for IDA.  Colonoscopy showed infiltrative, sessile and ulcerated nonobstructing medium-sized mass in the cecum.  Measures 4 x 3 cm. -s/p biopsy showing moderately differentiated adenocarcinoma.  MMR stable -CT chest abdomen pelvis showed 1.6 cm lesion in segment 7, could be cavernous hemangioma but cannot rule out metastatic disease.  No other sites of concern. -s/p right hemicolectomy with lymph node dissection with Dr. Maia Plan on 04/22/2023. Pathology showed moderately differentiated adenocarcinoma, 5.5 cm with extension into pericolic adipose tissue, 7/60 lymph nodes involved, margins negative, grade 2, liver biopsy with metastatic disease.  pT3 N2b M1  -MRI liver from 05/06/2023 showed 2.4 cm segment 6 hepatic lesion highly concerning for metastatic disease.  -The imaging and pathology report was discussed in detail with the patient and the husband.  Patient has stage IV colon cancer but with only 1 metastatic disease site in the segment 6 liver.  Will make urgent referral to hepatobiliary surgery at [facility] for resection.  She will also need adjuvant chemotherapy with  FOLFOX for 6 months.  Side effects were discussed in detail such as decreased blood count, increased risk of infection, anemia, need for blood transfusion, thrombocytopenia, fatigue, generalized weakness, nausea, vomiting, neuropathy, cold sensitivity, hair thinning, increase in liver enzymes, kidney dysfunction.  I have reached out to Dr. Maia Plan for port.  Will schedule her for chemotherapy class.  I would like to wait for opinion from hepatic surgery for the timing of the resection.  Accordingly, I will schedule the patient for FOLFOX chemo.  Could not add CEA level.  Will add with the next set of lab work.  # Iron deficiency anemia -Secondary to colon cancer -Completed IV Venofer x 5 doses.  Hemoglobin has improved  No orders of the defined types were placed in this encounter.  RTC TBD  The total time spent in the appointment was 45 minutes encounter with patients including review of chart and various tests results, discussions about plan of care and coordination of care plan   All questions were answered. The patient knows to call the clinic with any problems, questions or concerns. No barriers to learning was detected.  Michaelyn Barter, MD 9/30/20243:54 PM   HISTORY OF PRESENTING ILLNESS:  Shari Melendez 62 y.o. female with pmh of iron deficiency anemia and hyperlipidemia referred to medical oncology for new diagnosis of colon adenocarcinoma.  Interval history Patient was seen today as follow-up accompanied with her husband to discuss the pathology report and next steps in management. She has been doing well overall.  Denies any postoperative pain.  Eating drinking well.  I have reviewed her chart and materials related to her cancer extensively and collaborated history with the patient. Summary of oncologic history is as follows:  Oncology History  Colon adenocarcinoma (HCC)  04/08/2023 Procedure   Endoscopy showed 2 cm hiatal hernia.  Colonoscopy showed infiltrative, sessile and  ulcerated nonobstructing medium-sized mass in the cecum.  Partially circumferential.  Measures 4 x 3 cm.    Pathology Results        05/18/2023 Cancer Staging   Staging form: Colon and Rectum, AJCC 8th Edition - Pathologic: pT3, pN2b, pM1 - Signed by Michaelyn Barter, MD on 05/18/2023 Stage prefix: Initial diagnosis Total positive nodes: 7 Total nodes examined: 60 Histologic grading system: 4 grade system Histologic grade (G): G2   06/01/2023 -  Chemotherapy   Patient is on Treatment Plan : COLORECTAL FOLFOX q14d x 3 months       MEDICAL HISTORY:  Past Medical History:  Diagnosis Date   Adenocarcinoma of colon (HCC) 04/03/2023   a.) 4 x 3 cm cecal mass identified on colonoscopy during workup for IDA. Bx 04/02/2020 --> moderately differentiated adenocarcinoma; MMR stable.   Aortic atherosclerosis (HCC)    Arteriosclerotic cardiovascular disease    Carpal tunnel syndrome    Current smoker    Hiatal hernia    Hypercholesteremia    IDA (iron deficiency anemia)     SURGICAL HISTORY: Past Surgical History:  Procedure Laterality Date   COLONOSCOPY     ESOPHAGOGASTRODUODENOSCOPY      SOCIAL HISTORY: Social History   Socioeconomic History   Marital status: Married    Spouse name: Not on file   Number of children: Not on file   Years of education: Not on file   Highest education level: Not on file  Occupational History   Not on file  Tobacco Use   Smoking status: Every Day    Current packs/day: 1.00    Average packs/day: 1 pack/day for 38.0 years (38.0 ttl pk-yrs)    Types: Cigarettes   Smokeless tobacco: Never  Vaping Use   Vaping status: Never Used  Substance and Sexual Activity   Alcohol use: No    Alcohol/week: 0.0 standard drinks of alcohol   Drug use: No   Sexual activity: Not on file  Other Topics Concern   Not on file  Social History Narrative   Retiring in Sept. 2024    Social Determinants of Health   Financial Resource Strain: Low Risk  (10/20/2022)    Received from [facility], [facility]   Overall Financial Resource Strain (CARDIA)    Difficulty of Paying Living Expenses: Not hard at all  Food Insecurity: No Food Insecurity (04/22/2023)   Hunger Vital Sign    Worried About Running Out of Food in the Last Year: Never true    Ran Out of Food in the Last Year: Never true  Transportation Needs: No Transportation Needs (04/22/2023)   PRAPARE - Administrator, Civil Service (Medical): No    Lack of Transportation (Non-Medical): No  Physical Activity: Not on file  Stress: Not on file  Social Connections: Not on file  Intimate Partner Violence: Not At Risk (04/22/2023)   Humiliation, Afraid, Rape, and Kick questionnaire    Fear of Current or Ex-Partner: No    Emotionally Abused: No    Physically Abused: No    Sexually Abused: No    FAMILY HISTORY: Family History  Problem Relation Age of Onset   Breast cancer Mother 42   Heart disease Father    Breast cancer Maternal Aunt 42    ALLERGIES:  is allergic to sulfa antibiotics.  MEDICATIONS:  Current Outpatient Medications  Medication Sig Dispense Refill   atorvastatin (LIPITOR) 40 MG tablet Take 40 mg by mouth at bedtime.     ferrous sulfate ER (SLOW FE) 142 (45 Fe) MG TBCR tablet Take by mouth.     Omega-3 Fatty Acids (FISH OIL) 1000 MG CAPS Take by mouth daily.     ondansetron (ZOFRAN) 4 MG tablet Take 1 tablet (4 mg total) by mouth daily as needed for nausea or vomiting. 10 tablet 1   oxyCODONE-acetaminophen (PERCOCET) 5-325 MG tablet Take 1 tablet by mouth every 4 (four) hours as needed for severe pain. (Patient not taking: Reported on 05/18/2023) 20 tablet 0   No current facility-administered medications for this visit.    REVIEW OF SYSTEMS:   Pertinent information mentioned in HPI All other systems were reviewed with the patient and are negative.  PHYSICAL EXAMINATION: ECOG PERFORMANCE STATUS: 0 - Asymptomatic  Vitals:    05/18/23 0951  BP: 137/64  Pulse: 62  Temp: 97.7 F (36.5 C)  SpO2: 100%   Filed Weights   05/18/23 0951  Weight: 116 lb 12.8 oz (53 kg)    GENERAL:alert, no distress and comfortable SKIN: skin color, texture, turgor are normal, no rashes or significant lesions EYES: normal, conjunctiva are pink and non-injected, sclera clear OROPHARYNX:no exudate, no erythema and lips, buccal mucosa, and tongue normal  NECK: supple, thyroid normal size, non-tender, without nodularity LYMPH:  no palpable lymphadenopathy in the cervical, axillary or inguinal LUNGS: clear to auscultation and percussion with normal breathing effort HEART: regular rate & rhythm and no murmurs and no lower extremity edema ABDOMEN:abdomen soft, non-tender and normal bowel sounds Musculoskeletal:no cyanosis of digits and no clubbing  PSYCH: alert & oriented x 3 with fluent speech NEURO: no focal motor/sensory deficits  LABORATORY DATA:  I have reviewed the data as listed Lab Results  Component Value Date   WBC 7.5 05/18/2023   HGB 11.2 (L) 05/18/2023   HCT 34.8 (L) 05/18/2023   MCV 93.8 05/18/2023   PLT 291 05/18/2023   Recent Labs    04/09/23 1512 04/23/23 0500 05/18/23 0939  NA 137 135 137  K 3.8 3.5 4.2  CL 103 103 101  CO2 26 23 28   GLUCOSE 102* 102* 99  BUN 8 11 10   CREATININE 0.63 0.68 0.65  CALCIUM 9.0 8.1* 9.0  GFRNONAA >60 >60 >60  PROT 7.7  --  7.4  ALBUMIN 4.2  --  4.0  AST 19  --  18  ALT 17  --  18  ALKPHOS 89  --  89  BILITOT 0.3  --  0.7    RADIOGRAPHIC STUDIES: I have personally reviewed the radiological images as listed and agreed with the findings in the report. MR LIVER W WO CONTRAST  Result Date: 05/10/2023 CLINICAL DATA:  Follow-up hepatic lesion EXAM: MRI ABDOMEN WITHOUT AND WITH CONTRAST TECHNIQUE: Multiplanar multisequence MR imaging of the abdomen was performed both before and after the administration of intravenous contrast. CONTRAST:  5mL GADAVIST GADOBUTROL 1 MMOL/ML  IV SOLN COMPARISON:  CT April 13, 2023 FINDINGS: Lower chest: No acute abnormality. Hepatobiliary: Low signal throughout the hepatic parenchyma less than that of skeletal muscle on T2 and inphase T1 pulse sequences compatible with hepatic iron deposition. Slightly T2 hyperintense 2.4 cm segment VI hepatic lesion on image 19/12 demonstrates irregular peripheral postcontrast enhancement. No additional suspicious hepatic lesions identified. Gallbladder is unremarkable.  No biliary ductal dilation. Pancreas: No pancreatic ductal dilation or evidence of acute inflammation.  Spleen:  No splenomegaly or focal splenic lesion. Adrenals/Urinary Tract: No suspicious adrenal mass. Kidneys demonstrate symmetric enhancement. Stomach/Bowel: No evidence of bowel obstruction. Vascular/Lymphatic: No pathologically enlarged lymph nodes identified. No abdominal aortic aneurysm demonstrated. Other:  None. Musculoskeletal: No suspicious bone lesions identified. IMPRESSION: 1. 2.4 cm segment VI hepatic lesion demonstrates imaging characteristics highly suspicious for a mucinous metastatic lesion. 2. Hepatic iron deposition. Electronically Signed   By: Maudry Mayhew M.D.   On: 05/10/2023 11:19

## 2023-05-18 NOTE — Progress Notes (Signed)
Referral sent to HPB surgery at [facility]

## 2023-05-19 ENCOUNTER — Other Ambulatory Visit: Payer: Self-pay

## 2023-05-21 ENCOUNTER — Ambulatory Visit: Payer: Self-pay | Admitting: General Surgery

## 2023-05-21 ENCOUNTER — Other Ambulatory Visit: Payer: Self-pay

## 2023-05-21 NOTE — H&P (View-Only) (Signed)
HISTORY OF PRESENT ILLNESS:   [place] Warriner presents for a post-operative clinic visit following recent right hemicolectomy.  Patient with malignant neoplasm of the ascending colon. She underwent right hemicolectomy. Liver mass was biopsied. It was positive for metastatic adenocarcinoma of the colon.  Patient endorses that she has been recovering well. She has been feeling more comfortable at home. She is tolerating diet. She is having bowel movement. Denies any issues with the wound. Pain controlled.  PAST MEDICAL HISTORY:  Past Medical History:  Diagnosis Date  Carpal tunnel syndrome on both sides  Tobacco use 04/20/2018  Current smoker 1 pack for 38 years  Trigger finger of both hands  s/p surgical repair     PAST SURGICAL HISTORY:  Past Surgical History:  Procedure Laterality Date  Colon @ [facility] 04/03/2023  moderately differentiated adenocarcinoma/Repeat 6 to 12 months/TKT  EGD @ [facility] 04/03/2023  Normal EGD biopsy/No repeat/TKT  Rt Hemicolectomy 04/22/2023  Dr Lurline Idol Cintron  COLONOSCOPY 05/11/2014 [facility]  Normal/Repeat 69yrs/[facility]  INCISION TENDON SHEATH FOR TRIGGER FINGER  bilateral 3rd fingers    MEDICATIONS:  Outpatient Encounter Medications as of 05/05/2023  Medication Sig Dispense Refill  atorvastatin (LIPITOR) 40 MG tablet Take 1 tablet (40 mg total) by mouth at bedtime 90 tablet 3  cyanocobalamin (VITAMIN B12) 1000 MCG tablet Take 1,000 mcg by mouth once daily  docosahexaenoic acid/epa (FISH OIL ORAL) Take by mouth  ferrous sulfate (SLOW FE) 137 mg (45 mg iron) TbER Take 137 mg by mouth 2 (two) times daily 60 tablet 11  neomycin 500 mg tablet Take 2 tablets at 2 pm, 3 pm and 10 pm the day before surgery. 6 tablet 0  vit A/vit C/vit E/zinc/copper (PRESERVISION AREDS ORAL) Take by mouth  sodium, potassium, and magnesium (SUPREP) oral solution Take 1 Bottle by mouth as directed One kit contains 2 bottles. Take both bottles at the times instructed by your provider.  (Patient not taking: Reported on 04/10/2023) 354 mL 0   No facility-administered encounter medications on file as of 05/05/2023.    ALLERGIES:  Sulfa (sulfonamide antibiotics)   PHYSICAL EXAM:  Vitals:  05/05/23 0800  BP: 111/59  Pulse: 69  .  Ht:165.1 cm (5\' 5" ) Wt:55.3 kg (122 lb) QMV:HQIO surface area is 1.59 meters squared. Body mass index is 20.3 kg/m.Marland Kitchen  GENERAL: Alert, active, oriented x3  ABDOMEN: Soft and depressible, nontender with no palpable mass, no hepatomegaly. Wounds dry and clean.   IMPRESSION:   Malignant neoplasm of ascending colon (CMS/HHS-HCC) [C18.2] -Status post robotic assisted laparoscopic hemicolectomy -7 out of 16 lymph node positive for malignancy. -Biopsy of the liver positive for metastatic adenocarcinoma  -Patient is recovering well from surgery -Medical oncology order MRI of the abdomen for further evaluation of liver. -Plan is to refer patient to hepatic surgeon to consider metastasectomy -Patient will need adjuvant chemotherapy. Discussed about port placement  PLAN:   Expect medical oncology appointment for discussion of next step in the management of your colon cancer We discussed about chemo port placement. Will contact you for coordination of port placement once medical oncology coordinate the start of your chemotherapy I will see you in 4 weeks for wound follow-up but contact us if you have any concern  Patient and her husband verbalized understanding, all questions were answered, and were agreeable with the plan outlined above.   Carolan Shiver, MD  Electronically signed by Carolan Shiver, MD

## 2023-05-21 NOTE — H&P (Signed)
HISTORY OF PRESENT ILLNESS:   [place] Warriner presents for a post-operative clinic visit following recent right hemicolectomy.  Patient with malignant neoplasm of the ascending colon. She underwent right hemicolectomy. Liver mass was biopsied. It was positive for metastatic adenocarcinoma of the colon.  Patient endorses that she has been recovering well. She has been feeling more comfortable at home. She is tolerating diet. She is having bowel movement. Denies any issues with the wound. Pain controlled.  PAST MEDICAL HISTORY:  Past Medical History:  Diagnosis Date  Carpal tunnel syndrome on both sides  Tobacco use 04/20/2018  Current smoker 1 pack for 38 years  Trigger finger of both hands  s/p surgical repair     PAST SURGICAL HISTORY:  Past Surgical History:  Procedure Laterality Date  Colon @ [facility] 04/03/2023  moderately differentiated adenocarcinoma/Repeat 6 to 12 months/TKT  EGD @ [facility] 04/03/2023  Normal EGD biopsy/No repeat/TKT  Rt Hemicolectomy 04/22/2023  Dr Lurline Idol Cintron  COLONOSCOPY 05/11/2014 [facility]  Normal/Repeat 69yrs/[facility]  INCISION TENDON SHEATH FOR TRIGGER FINGER  bilateral 3rd fingers    MEDICATIONS:  Outpatient Encounter Medications as of 05/05/2023  Medication Sig Dispense Refill  atorvastatin (LIPITOR) 40 MG tablet Take 1 tablet (40 mg total) by mouth at bedtime 90 tablet 3  cyanocobalamin (VITAMIN B12) 1000 MCG tablet Take 1,000 mcg by mouth once daily  docosahexaenoic acid/epa (FISH OIL ORAL) Take by mouth  ferrous sulfate (SLOW FE) 137 mg (45 mg iron) TbER Take 137 mg by mouth 2 (two) times daily 60 tablet 11  neomycin 500 mg tablet Take 2 tablets at 2 pm, 3 pm and 10 pm the day before surgery. 6 tablet 0  vit A/vit C/vit E/zinc/copper (PRESERVISION AREDS ORAL) Take by mouth  sodium, potassium, and magnesium (SUPREP) oral solution Take 1 Bottle by mouth as directed One kit contains 2 bottles. Take both bottles at the times instructed by your provider.  (Patient not taking: Reported on 04/10/2023) 354 mL 0   No facility-administered encounter medications on file as of 05/05/2023.    ALLERGIES:  Sulfa (sulfonamide antibiotics)   PHYSICAL EXAM:  Vitals:  05/05/23 0800  BP: 111/59  Pulse: 69  .  Ht:165.1 cm (5\' 5" ) Wt:55.3 kg (122 lb) QMV:HQIO surface area is 1.59 meters squared. Body mass index is 20.3 kg/m.Marland Kitchen  GENERAL: Alert, active, oriented x3  ABDOMEN: Soft and depressible, nontender with no palpable mass, no hepatomegaly. Wounds dry and clean.   IMPRESSION:   Malignant neoplasm of ascending colon (CMS/HHS-HCC) [C18.2] -Status post robotic assisted laparoscopic hemicolectomy -7 out of 16 lymph node positive for malignancy. -Biopsy of the liver positive for metastatic adenocarcinoma  -Patient is recovering well from surgery -Medical oncology order MRI of the abdomen for further evaluation of liver. -Plan is to refer patient to hepatic surgeon to consider metastasectomy -Patient will need adjuvant chemotherapy. Discussed about port placement  PLAN:   Expect medical oncology appointment for discussion of next step in the management of your colon cancer We discussed about chemo port placement. Will contact you for coordination of port placement once medical oncology coordinate the start of your chemotherapy I will see you in 4 weeks for wound follow-up but contact us if you have any concern  Patient and her husband verbalized understanding, all questions were answered, and were agreeable with the plan outlined above.   Carolan Shiver, MD  Electronically signed by Carolan Shiver, MD

## 2023-05-22 ENCOUNTER — Other Ambulatory Visit: Payer: Self-pay

## 2023-05-22 ENCOUNTER — Encounter
Admission: RE | Admit: 2023-05-22 | Discharge: 2023-05-22 | Disposition: A | Discharge status: Home / Self Care | Payer: No Typology Code available for payment source | Source: Ambulatory Visit | Attending: General Surgery | Admitting: General Surgery

## 2023-05-22 VITALS — Ht 65.5 in | Wt 118.0 lb

## 2023-05-22 DIAGNOSIS — E785 Hyperlipidemia, unspecified: Secondary | ICD-10-CM

## 2023-05-22 NOTE — Patient Instructions (Addendum)
Your procedure is scheduled on: Friday 05/29/23 To find out your arrival time, please call [phone] between 1PM - 3PM on: Thursday 05/28/23   Report to the Registration Desk on the 1st floor of the [facility]. FREE Valet parking is available.  If your arrival time is 6:00 am, do not arrive before that time as the [facility] entrance doors do not open until 6:00 am.  REMEMBER: Instructions that are not followed completely may result in serious medical risk, up to and including death; or upon the discretion of your surgeon and anesthesiologist your surgery may need to be rescheduled.  Do not eat food or drink any liquids after midnight the night before surgery.  No gum chewing or hard candies.  One week prior to surgery: Stop Anti-inflammatories (NSAIDS) such as Advil, Aleve, Ibuprofen, Motrin, Naproxen, Naprosyn and Aspirin based products such as Excedrin, Goody's Powder, BC Powder. You may however, continue to take Tylenol if needed for pain up until the day of surgery.  Stop ANY OVER THE COUNTER supplements until after surgery.  Continue taking all prescribed medications.   TAKE ONLY THESE MEDICATIONS THE MORNING OF SURGERY WITH A SIP OF WATER:  none  No Alcohol for 24 hours before or after surgery.  No Smoking including e-cigarettes for 24 hours before surgery.  No chewable tobacco products for at least 6 hours before surgery.  No nicotine patches on the day of surgery.  Do not use any "recreational" drugs for at least a week (preferably 2 weeks) before your surgery.  Please be advised that the combination of cocaine and anesthesia may have negative outcomes, up to and including death. If you test positive for cocaine, your surgery will be cancelled.  On the morning of surgery brush your teeth with toothpaste and water, you may rinse your mouth with mouthwash if you wish. Do not swallow any toothpaste or mouthwash.  Use CHG Soap or wipes as directed on instruction  sheet.  Do not wear lotions, powders, or perfumes. NO DEODORANT  Do not shave body hair from the neck down 48 hours before surgery.  Wear comfortable clothing (specific to your surgery type) to the hospital.  Do not wear jewelry, make-up, hairpins, clips or nail polish.  For welded (permanent) jewelry: bracelets, anklets, waist bands, etc.  Please have this removed prior to surgery.  If it is not removed, there is a chance that hospital personnel will need to cut it off on the day of surgery. Contact lenses, hearing aids and dentures may not be worn into surgery.  Do not bring valuables to the hospital. [facility] is not responsible for any missing/lost belongings or valuables.   Notify your doctor if there is any change in your medical condition (cold, fever, infection).  If you are being discharged the day of surgery, you will not be allowed to drive home. You will need a responsible individual to drive you home and stay with you for 24 hours after surgery.   If you are taking public transportation, you will need to have a responsible individual with you.  If you are being admitted to the hospital overnight, leave your suitcase in the car. After surgery it may be brought to your room.  In case of increased patient census, it may be necessary for you, the patient, to continue your postoperative care in the Same Day Surgery department.  After surgery, you can help prevent lung complications by doing breathing exercises.  Take deep breaths and cough every  1-2 hours. Your doctor may order a device called an Incentive Spirometer to help you take deep breaths. When coughing or sneezing, hold a pillow firmly against your incision with both hands. This is called "splinting." Doing this helps protect your incision. It also decreases belly discomfort.  Surgery Visitation Policy:  Patients undergoing a surgery or procedure may have two family members or support persons with them as long as  the person is not COVID-19 positive or experiencing its symptoms.   Inpatient Visitation:    Visiting hours are 7 a.m. to 8 p.m. Up to four visitors are allowed at one time in a patient room. The visitors may rotate out with other people during the day. One designated support person (adult) may remain overnight.  Please call the Pre-admissions Testing Dept. at [phone] if you have any questions about these instructions.     Preparing for Surgery with CHLORHEXIDINE GLUCONATE (CHG) Soap  Chlorhexidine Gluconate (CHG) Soap  o An antiseptic cleaner that kills germs and bonds with the skin to continue killing germs even after washing  o Used for showering the night before surgery and morning of surgery  Before surgery, you can play an important role by reducing the number of germs on your skin.  CHG (Chlorhexidine gluconate) soap is an antiseptic cleanser which kills germs and bonds with the skin to continue killing germs even after washing.  Please do not use if you have an allergy to CHG or antibacterial soaps. If your skin becomes reddened/irritated stop using the CHG.  1. Shower the NIGHT BEFORE SURGERY and the MORNING OF SURGERY with CHG soap.  2. If you choose to wash your hair, wash your hair first as usual with your normal shampoo.  3. After shampooing, rinse your hair and body thoroughly to remove the shampoo.  4. Use CHG as you would any other liquid soap. You can apply CHG directly to the skin and wash gently with a scrungie or a clean washcloth.  5. Apply the CHG soap to your body only from the neck down. Do not use on open wounds or open sores. Avoid contact with your eyes, ears, mouth, and genitals (private parts). Wash face and genitals (private parts) with your normal soap.  6. Wash thoroughly, paying special attention to the area where your surgery will be performed.  7. Thoroughly rinse your body with warm water.  8. Do not shower/wash with your normal soap  after using and rinsing off the CHG soap.  9. Pat yourself dry with a clean towel.  10. Wear clean pajamas to bed the night before surgery.  12. Place clean sheets on your bed the night of your first shower and do not sleep with pets.  13. Shower again with the CHG soap on the day of surgery prior to arriving at the hospital.  14. Do not apply any deodorants/lotions/powders.  15. Please wear clean clothes to the hospital.

## 2023-05-25 ENCOUNTER — Inpatient Hospital Stay: Payer: No Typology Code available for payment source | Attending: Internal Medicine

## 2023-05-25 DIAGNOSIS — Z87891 Personal history of nicotine dependence: Secondary | ICD-10-CM | POA: Insufficient documentation

## 2023-05-25 DIAGNOSIS — D509 Iron deficiency anemia, unspecified: Secondary | ICD-10-CM | POA: Insufficient documentation

## 2023-05-25 DIAGNOSIS — Z5111 Encounter for antineoplastic chemotherapy: Secondary | ICD-10-CM | POA: Insufficient documentation

## 2023-05-25 DIAGNOSIS — Z803 Family history of malignant neoplasm of breast: Secondary | ICD-10-CM | POA: Insufficient documentation

## 2023-05-25 DIAGNOSIS — C189 Malignant neoplasm of colon, unspecified: Secondary | ICD-10-CM | POA: Insufficient documentation

## 2023-05-25 DIAGNOSIS — C787 Secondary malignant neoplasm of liver and intrahepatic bile duct: Secondary | ICD-10-CM | POA: Insufficient documentation

## 2023-05-26 ENCOUNTER — Telehealth: Payer: Self-pay | Admitting: *Deleted

## 2023-05-26 ENCOUNTER — Encounter: Payer: Self-pay | Admitting: Internal Medicine

## 2023-05-26 ENCOUNTER — Other Ambulatory Visit: Payer: Self-pay | Admitting: Oncology

## 2023-05-26 ENCOUNTER — Encounter
Admission: RE | Admit: 2023-05-26 | Discharge: 2023-05-26 | Disposition: A | Discharge status: Home / Self Care | Payer: No Typology Code available for payment source | Source: Ambulatory Visit | Attending: General Surgery | Admitting: General Surgery

## 2023-05-26 DIAGNOSIS — Z01810 Encounter for preprocedural cardiovascular examination: Secondary | ICD-10-CM | POA: Diagnosis not present

## 2023-05-26 DIAGNOSIS — E785 Hyperlipidemia, unspecified: Secondary | ICD-10-CM | POA: Insufficient documentation

## 2023-05-26 MED ORDER — ONDANSETRON HCL 8 MG PO TABS: 8.0000 mg | ORAL_TABLET | Freq: Three times a day (TID) | ORAL | 1 refills | Status: AC | PRN

## 2023-05-26 MED ORDER — DEXAMETHASONE 4 MG PO TABS
8.0000 mg | ORAL_TABLET | Freq: Every day | ORAL | 1 refills | Status: AC
Start: 2023-05-26 — End: ?

## 2023-05-26 MED ORDER — LIDOCAINE-PRILOCAINE 2.5-2.5 % EX CREA: TOPICAL_CREAM | CUTANEOUS | 3 refills | Status: AC

## 2023-05-26 MED ORDER — PROCHLORPERAZINE MALEATE 10 MG PO TABS: 10.0000 mg | ORAL_TABLET | Freq: Four times a day (QID) | ORAL | 1 refills | Status: AC | PRN

## 2023-05-26 NOTE — Addendum Note (Signed)
Addended byMichaelyn Barter on: 05/26/2023 09:42 AM   Modules accepted: Orders

## 2023-05-26 NOTE — Telephone Encounter (Signed)
Call from [facility] to let us know their recommendations for patient to have 3 months of chemotherapy,then go back to them for re imaging and evaluate for possible surgical resection. She is asking for a return call to confirm that you received this message.

## 2023-05-26 NOTE — Telephone Encounter (Signed)
Call returned to Dawna Part, NP and advised that message has been received and patient is to be scheduled for chemotherapy next week

## 2023-05-26 NOTE — Addendum Note (Signed)
Addended byMichaelyn Barter on: 05/26/2023 11:23 AM   Modules accepted: Orders

## 2023-05-27 ENCOUNTER — Other Ambulatory Visit: Payer: Self-pay

## 2023-05-28 ENCOUNTER — Encounter: Payer: Self-pay | Admitting: Oncology

## 2023-05-28 ENCOUNTER — Other Ambulatory Visit: Payer: Self-pay | Admitting: Oncology

## 2023-05-28 MED ORDER — FAMOTIDINE 20 MG PO TABS
20.0000 mg | ORAL_TABLET | Freq: Once | ORAL | Status: AC
  Administered 2023-05-29: 20 mg via ORAL

## 2023-05-28 MED ORDER — CEFAZOLIN SODIUM-DEXTROSE 2-4 GM/100ML-% IV SOLN
2.0000 g | INTRAVENOUS | Status: AC
  Administered 2023-05-29: 2 g via INTRAVENOUS

## 2023-05-28 MED ORDER — ORAL CARE MOUTH RINSE: 15.0000 mL | Freq: Once | OROMUCOSAL | Status: AC

## 2023-05-28 MED ORDER — CHLORHEXIDINE GLUCONATE 0.12 % MT SOLN
15.0000 mL | Freq: Once | OROMUCOSAL | Status: AC
  Administered 2023-05-29: 15 mL via OROMUCOSAL

## 2023-05-28 MED ORDER — LACTATED RINGERS IV SOLN: INTRAVENOUS | Status: DC

## 2023-05-28 NOTE — Progress Notes (Signed)
Pharmacist Chemotherapy Monitoring - Initial Assessment    Anticipated start date: 06/01/23   The following has been reviewed per standard work regarding the patient's treatment regimen: The patient's diagnosis, treatment plan and drug doses, and organ/hematologic function Lab orders and baseline tests specific to treatment regimen  The treatment plan start date, drug sequencing, and pre-medications Prior authorization status  Patient's documented medication list, including drug-drug interaction screen and prescriptions for anti-emetics and supportive care specific to the treatment regimen The drug concentrations, fluid compatibility, administration routes, and timing of the medications to be used The patient's access for treatment and lifetime cumulative dose history, if applicable  The patient's medication allergies and previous infusion related reactions, if applicable   Changes made to treatment plan:  N/A  Follow up needed:  N/A   Sharen Hones, PharmD, BCPS Clinical Pharmacist   05/28/2023  1:54 PM

## 2023-05-29 ENCOUNTER — Other Ambulatory Visit: Payer: Self-pay

## 2023-05-29 ENCOUNTER — Ambulatory Visit: Payer: No Typology Code available for payment source | Admitting: Registered Nurse

## 2023-05-29 ENCOUNTER — Encounter: Payer: Self-pay | Admitting: General Surgery

## 2023-05-29 ENCOUNTER — Ambulatory Visit: Payer: No Typology Code available for payment source

## 2023-05-29 ENCOUNTER — Ambulatory Visit
Admission: RE | Admit: 2023-05-29 | Discharge: 2023-05-29 | Disposition: A | Discharge status: Home / Self Care | Payer: No Typology Code available for payment source | Attending: General Surgery | Admitting: General Surgery

## 2023-05-29 ENCOUNTER — Encounter
Admission: RE | Disposition: A | Discharge status: Home / Self Care | Payer: Self-pay | Source: Home / Self Care | Attending: General Surgery

## 2023-05-29 DIAGNOSIS — C182 Malignant neoplasm of ascending colon: Secondary | ICD-10-CM | POA: Diagnosis not present

## 2023-05-29 DIAGNOSIS — Z9049 Acquired absence of other specified parts of digestive tract: Secondary | ICD-10-CM | POA: Insufficient documentation

## 2023-05-29 DIAGNOSIS — C787 Secondary malignant neoplasm of liver and intrahepatic bile duct: Secondary | ICD-10-CM | POA: Diagnosis not present

## 2023-05-29 DIAGNOSIS — Z452 Encounter for adjustment and management of vascular access device: Secondary | ICD-10-CM | POA: Insufficient documentation

## 2023-05-29 HISTORY — PX: PORTACATH PLACEMENT: SHX2246

## 2023-05-29 SURGERY — INSERTION, TUNNELED CENTRAL VENOUS DEVICE, WITH PORT
Anesthesia: General | Site: Chest

## 2023-05-29 MED ORDER — LIDOCAINE HCL (PF) 2 % IJ SOLN
INTRAMUSCULAR | Status: AC
  Filled 2023-05-29: qty 5

## 2023-05-29 MED ORDER — GLYCOPYRROLATE 0.2 MG/ML IJ SOLN
INTRAMUSCULAR | Status: DC | PRN
  Administered 2023-05-29: .1 mg via INTRAVENOUS

## 2023-05-29 MED ORDER — MIDAZOLAM HCL 2 MG/2ML IJ SOLN
INTRAMUSCULAR | Status: AC
  Filled 2023-05-29: qty 2

## 2023-05-29 MED ORDER — LIDOCAINE HCL (CARDIAC) PF 100 MG/5ML IV SOSY
PREFILLED_SYRINGE | INTRAVENOUS | Status: DC | PRN
  Administered 2023-05-29: 40 mg via INTRAVENOUS

## 2023-05-29 MED ORDER — PROPOFOL 500 MG/50ML IV EMUL
INTRAVENOUS | Status: DC | PRN
  Administered 2023-05-29: 125 ug/kg/min via INTRAVENOUS

## 2023-05-29 MED ORDER — ONDANSETRON HCL 4 MG/2ML IJ SOLN: 4.0000 mg | Freq: Once | INTRAMUSCULAR | Status: DC | PRN

## 2023-05-29 MED ORDER — HYDROCODONE-ACETAMINOPHEN 5-325 MG PO TABS: 1.0000 | ORAL_TABLET | ORAL | 0 refills | Status: AC | PRN

## 2023-05-29 MED ORDER — HEPARIN SODIUM (PORCINE) 5000 UNIT/ML IJ SOLN
INTRAMUSCULAR | Status: AC
  Filled 2023-05-29: qty 1

## 2023-05-29 MED ORDER — MIDAZOLAM HCL 2 MG/2ML IJ SOLN
INTRAMUSCULAR | Status: DC | PRN
  Administered 2023-05-29: 2 mg via INTRAVENOUS

## 2023-05-29 MED ORDER — FENTANYL CITRATE (PF) 100 MCG/2ML IJ SOLN
INTRAMUSCULAR | Status: AC
  Filled 2023-05-29: qty 2

## 2023-05-29 MED ORDER — BUPIVACAINE-EPINEPHRINE (PF) 0.25% -1:200000 IJ SOLN
INTRAMUSCULAR | Status: AC
  Filled 2023-05-29: qty 30

## 2023-05-29 MED ORDER — FENTANYL CITRATE (PF) 100 MCG/2ML IJ SOLN: 25.0000 ug | INTRAMUSCULAR | Status: DC | PRN

## 2023-05-29 MED ORDER — BUPIVACAINE-EPINEPHRINE (PF) 0.25% -1:200000 IJ SOLN
INTRAMUSCULAR | Status: DC | PRN
  Administered 2023-05-29: 12 mL

## 2023-05-29 MED ORDER — GLYCOPYRROLATE 0.2 MG/ML IJ SOLN
INTRAMUSCULAR | Status: AC
  Filled 2023-05-29: qty 1

## 2023-05-29 MED ORDER — PROPOFOL 10 MG/ML IV BOLUS
INTRAVENOUS | Status: DC | PRN
  Administered 2023-05-29: 50 mg via INTRAVENOUS

## 2023-05-29 MED ORDER — SODIUM CHLORIDE (PF) 0.9 % IJ SOLN
INTRAMUSCULAR | Status: AC
  Filled 2023-05-29: qty 50

## 2023-05-29 MED ORDER — PROPOFOL 1000 MG/100ML IV EMUL
INTRAVENOUS | Status: AC
  Filled 2023-05-29: qty 100

## 2023-05-29 MED ORDER — SODIUM CHLORIDE 0.9 % IV SOLN
INTRAVENOUS | Status: DC | PRN
  Administered 2023-05-29: 50 mL via INTRAMUSCULAR

## 2023-05-29 MED ORDER — ONDANSETRON HCL 4 MG/2ML IJ SOLN
INTRAMUSCULAR | Status: DC | PRN
  Administered 2023-05-29: 4 mg via INTRAVENOUS

## 2023-05-29 MED ORDER — 0.9 % SODIUM CHLORIDE (POUR BTL) OPTIME
TOPICAL | Status: DC | PRN
  Administered 2023-05-29: 100 mL

## 2023-05-29 MED ORDER — PHENYLEPHRINE 80 MCG/ML (10ML) SYRINGE FOR IV PUSH (FOR BLOOD PRESSURE SUPPORT)
PREFILLED_SYRINGE | INTRAVENOUS | Status: DC | PRN
  Administered 2023-05-29: 120 ug via INTRAVENOUS
  Administered 2023-05-29 (×2): 80 ug via INTRAVENOUS

## 2023-05-29 MED ORDER — DEXAMETHASONE SODIUM PHOSPHATE 10 MG/ML IJ SOLN
INTRAMUSCULAR | Status: AC
  Filled 2023-05-29: qty 1

## 2023-05-29 MED ORDER — ONDANSETRON HCL 4 MG/2ML IJ SOLN
INTRAMUSCULAR | Status: AC
  Filled 2023-05-29: qty 2

## 2023-05-29 MED ORDER — CHLORHEXIDINE GLUCONATE 0.12 % MT SOLN
OROMUCOSAL | Status: AC
  Filled 2023-05-29: qty 15

## 2023-05-29 MED ORDER — FAMOTIDINE 20 MG PO TABS
ORAL_TABLET | ORAL | Status: AC
  Filled 2023-05-29: qty 1

## 2023-05-29 MED FILL — Dexamethasone Sodium Phosphate Inj 100 MG/10ML: INTRAMUSCULAR | Qty: 1 | Status: AC

## 2023-05-29 SURGICAL SUPPLY — 36 items
5 PAIRS OF YELLOW SUTURE CLAMP (MISCELLANEOUS) ×1
ADH SKN CLS APL DERMABOND .7 (GAUZE/BANDAGES/DRESSINGS) ×1
BAG DECANTER FOR FLEXI CONT (MISCELLANEOUS) ×1 IMPLANT
BLADE SURG 15 STRL LF DISP TIS (BLADE) ×1 IMPLANT
BLADE SURG 15 STRL SS (BLADE) ×1
BLADE SURG SZ11 CARB STEEL (BLADE) ×1 IMPLANT
BOOT SUTURE VASCULAR YLW (MISCELLANEOUS) ×1
CLAMP SUTURE YELLOW 5 PAIRS (MISCELLANEOUS) ×1 IMPLANT
DERMABOND ADVANCED .7 DNX12 (GAUZE/BANDAGES/DRESSINGS) ×1 IMPLANT
DRAPE C-ARM XRAY 36X54 (DRAPES) ×1 IMPLANT
ELECT REM PT RETURN 9FT ADLT (ELECTROSURGICAL) ×1
ELECTRODE REM PT RTRN 9FT ADLT (ELECTROSURGICAL) ×1 IMPLANT
GLOVE BIO SURGEON STRL SZ 6.5 (GLOVE) ×1 IMPLANT
GLOVE BIOGEL PI IND STRL 6.5 (GLOVE) ×1 IMPLANT
GOWN STRL REUS W/ TWL LRG LVL3 (GOWN DISPOSABLE) ×2 IMPLANT
GOWN STRL REUS W/TWL LRG LVL3 (GOWN DISPOSABLE) ×2
IV NS 500ML (IV SOLUTION) ×1
IV NS 500ML BAXH (IV SOLUTION) ×1 IMPLANT
KIT PORT INFUSION SMART 8FR (Port) ×1 IMPLANT
KIT TURNOVER KIT A (KITS) ×1 IMPLANT
LABEL OR SOLS (LABEL) ×1 IMPLANT
MANIFOLD NEPTUNE II (INSTRUMENTS) ×1 IMPLANT
NDL FILTER BLUNT 18X1 1/2 (NEEDLE) ×1 IMPLANT
NEEDLE FILTER BLUNT 18X1 1/2 (NEEDLE) ×1 IMPLANT
PACK PORT-A-CATH (MISCELLANEOUS) ×1 IMPLANT
SUT MNCRL AB 4-0 PS2 18 (SUTURE) ×1 IMPLANT
SUT PROLENE 2 0 FS (SUTURE) ×1 IMPLANT
SUT VIC AB 2-0 SH 27 (SUTURE) ×1
SUT VIC AB 2-0 SH 27XBRD (SUTURE) ×1 IMPLANT
SUT VIC AB 3-0 SH 27 (SUTURE) ×1
SUT VIC AB 3-0 SH 27X BRD (SUTURE) ×1 IMPLANT
SYR 10ML LL (SYRINGE) ×2 IMPLANT
SYR 3ML LL SCALE MARK (SYRINGE) ×1 IMPLANT
TAG SUTURE CLAMP YLW 5PR (MISCELLANEOUS) ×1
TRAP FLUID SMOKE EVACUATOR (MISCELLANEOUS) ×1 IMPLANT
WATER STERILE IRR 500ML POUR (IV SOLUTION) ×1 IMPLANT

## 2023-05-29 NOTE — Transfer of Care (Signed)
Immediate Anesthesia Transfer of Care Note  Patient: Shari Melendez  Procedure(s) Performed: INSERTION PORT-A-CATH (Chest)  Patient Location: PACU  Anesthesia Type:General  Level of Consciousness: drowsy  Airway & Oxygen Therapy: Patient Spontanous Breathing and Patient connected to face mask oxygen  Post-op Assessment: Report given to RN and Post -op Vital signs reviewed and stable  Post vital signs: Reviewed and stable  Last Vitals:  Vitals Value Taken Time  BP 124/70 05/29/23 1234  Temp 35.8 C 05/29/23 1234  Pulse 71 05/29/23 1235  Resp 14 05/29/23 1235  SpO2 100 % 05/29/23 1235  Vitals shown include unfiled device data.  Last Pain:  Vitals:   05/29/23 1234  TempSrc:   PainSc: Asleep         Complications: No notable events documented.

## 2023-05-29 NOTE — Op Note (Signed)
SURGICAL PROCEDURE REPORT  DATE OF PROCEDURE: 05/29/2023   SURGEON: Dr. Hazle Quant   ANESTHESIA: Local with light IV sedation   PRE-OPERATIVE DIAGNOSIS: Metastatic colon cancer requiring durable central venous access for chemotherapy   POST-OPERATIVE DIAGNOSIS: Same  PROCEDURE(S): (cpt: 36561) 1.) Percutaneous access of Right internal jugular vein under ultrasound guidance 2.) Insertion of tunneled Right internal jugular central venous catheter with subcutaneous port  INTRAOPERATIVE FINDINGS: Patent easily compressible Right internal jugular vein with appropriate respiratory variations and well-secured tunneled central venous catheter with subcutaneous port at completion of the procedure  ESTIMATED BLOOD LOSS: Minimal (<20 mL)   SPECIMENS: None   IMPLANTS: 62F tunneled Bard PowerPort central venous catheter with subcutaneous port  DRAINS: None   COMPLICATIONS: None apparent   CONDITION AT COMPLETION: Hemodynamically stable, awake   DISPOSITION: PACU   INDICATION(S) FOR PROCEDURE:  Patient is a 62 y.o. female who presented with metastatic colon cancer requiring durable central venous access for chemotherapy. All risks, benefits, and alternatives to above elective procedures were discussed with the patient, who elected to proceed, and informed consent was accordingly obtained at that time.  DETAILS OF PROCEDURE:  Patient was brought to the operative suite and appropriately identified. In Trendelenburg position, Right IJ venous access site was prepped and draped in the usual sterile fashion, and following a brief timeout, percutaneous right IJ venous access was obtained under ultrasound guidance using Seldinger technique, by which local anesthetic was injected over the Right IJ vein, and access needle was inserted under direct ultrasound visualization into the Right IJ vein, through which soft guidewire was advanced, over which access needle was withdrawn. Guidewire was secured,  attention was directed to injection of local anesthetic along the planned tunnel site, 2-3 cm transverse Right chest incision was made and confirmed to accommodate the subcutaneous port, and flushed catheter was tunneled retrograde from the port site over the Right chest to the Right IJ access site with the attached port well-secured to the catheter and within the subcutaneous pocket. Insertion sheath was advanced over the guidewire, which was withdrawn along with the insertion sheath dilator. The catheter was introduced through the sheath and left on the [place] junction under fluoro guidance and catheter cut to desire lenght. Catheter connected to port and fixed to the pocket on two side to avoid twisting. Port was confirmed to withdraw blood and flush easily, after which concentrated heparin was instilled into the port and catheter. Dermis at the subcutaneous pocket was re-approximated using buried interrupted 3-0 Vicryl suture, and 4-0 Monocryl suture was used to re-approximate skin at the insertion and subcutaneous port sites in running subcuticular fashion for the subcutaneous port and buried interrupted fashion for the insertion site. Skin was cleaned, dried, and sterile skin glue was applied. Patient was then safely transferred to PACU for a chest x-ray. Ultrasound images are available on paper chart and Fluoroscopy guidance images are available in Epic.

## 2023-05-29 NOTE — Anesthesia Preprocedure Evaluation (Addendum)
Anesthesia Evaluation  Patient identified by MRN, date of birth, ID band Patient awake    Reviewed: Allergy & Precautions, NPO status , Patient's Chart, lab work & pertinent test results  Airway Mallampati: II  TM Distance: >3 FB Neck ROM: Full    Dental  (+) Teeth Intact   Pulmonary neg pulmonary ROS, COPD, Patient abstained from smoking., former smoker   Pulmonary exam normal breath sounds clear to auscultation       Cardiovascular Exercise Tolerance: Good negative cardio ROS Normal cardiovascular exam Rhythm:Regular Rate:Normal     Neuro/Psych negative neurological ROS  negative psych ROS   GI/Hepatic negative GI ROS, Neg liver ROS, hiatal hernia,,,  Endo/Other  negative endocrine ROS    Renal/GU negative Renal ROS     Musculoskeletal   Abdominal Normal abdominal exam  (+)   Peds negative pediatric ROS (+)  Hematology negative hematology ROS (+) Blood dyscrasia, anemia   Anesthesia Other Findings Past Medical History: 04/03/2023: Adenocarcinoma of colon (HCC)     Comment:  a.) 4 x 3 cm cecal mass identified on colonoscopy during              workup for IDA. Bx 04/02/2020 --> moderately               differentiated adenocarcinoma; MMR stable. No date: Aortic atherosclerosis (HCC) No date: Arteriosclerotic cardiovascular disease No date: Carpal tunnel syndrome No date: Current smoker No date: Hiatal hernia No date: Hypercholesteremia No date: IDA (iron deficiency anemia)  Past Surgical History: No date: COLONOSCOPY No date: ESOPHAGOGASTRODUODENOSCOPY     Reproductive/Obstetrics negative OB ROS                             Anesthesia Physical Anesthesia Plan  ASA: 2  Anesthesia Plan: General   Post-op Pain Management:    Induction: Intravenous  PONV Risk Score and Plan: 1 and Ondansetron and Dexamethasone  Airway Management Planned: LMA  Additional Equipment:    Intra-op Plan:   Post-operative Plan: Extubation in OR  Informed Consent: I have reviewed the patients History and Physical, chart, labs and discussed the procedure including the risks, benefits and alternatives for the proposed anesthesia with the patient or authorized representative who has indicated his/her understanding and acceptance.     Dental Advisory Given  Plan Discussed with: CRNA and Surgeon  Anesthesia Plan Comments:        Anesthesia Quick Evaluation

## 2023-05-29 NOTE — Interval H&P Note (Signed)
History and Physical Interval Note:  05/29/2023 11:20 AM  Shari Melendez  has presented today for surgery, with the diagnosis of cancer of ascending colon C18.2.  The various methods of treatment have been discussed with the patient and family. After consideration of risks, benefits and other options for treatment, the patient has consented to  Procedure(s): INSERTION PORT-A-CATH (N/A) as a surgical intervention.  The patient's history has been reviewed, patient examined, no change in status, stable for surgery.  I have reviewed the patient's chart and labs.  Questions were answered to the patient's satisfaction.     Carolan Shiver

## 2023-05-29 NOTE — Anesthesia Postprocedure Evaluation (Signed)
Anesthesia Post Note  Patient: Shari Melendez  Procedure(s) Performed: INSERTION PORT-A-CATH (Chest)  Patient location during evaluation: PACU Anesthesia Type: General Level of consciousness: awake Pain management: pain level controlled Vital Signs Assessment: post-procedure vital signs reviewed and stable Respiratory status: spontaneous breathing Cardiovascular status: stable Anesthetic complications: no   No notable events documented.   Last Vitals:  Vitals:   05/29/23 1234 05/29/23 1245  BP: 124/70 130/74  Pulse: 76 70  Resp: 11 13  Temp: (!) 35.8 C (!) 36.2 C  SpO2: 100% 100%    Last Pain:  Vitals:   05/29/23 1245  TempSrc:   PainSc: 0-No pain                 VAN STAVEREN,Amarya Kuehl

## 2023-05-29 NOTE — Discharge Instructions (Addendum)
  Diet: Resume home heart healthy regular diet.   Activity: Increase activity as tolerated. Light activity and walking are encouraged. Do not drive or drink alcohol if taking narcotic pain medications.  Wound care: May shower with soapy water and pat dry (do not rub incisions), but no baths or submerging incision underwater until follow-up. (no swimming)   Medications: Resume all home medications. For mild to moderate pain: acetaminophen (Tylenol) or ibuprofen (if no kidney disease). Combining Tylenol with alcohol can substantially increase your risk of causing liver disease. Narcotic pain medications, if prescribed, can be used for severe pain, though may cause nausea, constipation, and drowsiness. Do not combine Tylenol and Norco within a 6 hour period as Norco contains Tylenol. If you do not need the narcotic pain medication, you do not need to fill the prescription.  Call office ([phone]) at any time if any questions, worsening pain, fevers/chills, bleeding, drainage from incision site, or other concerns. AMBULATORY SURGERY  DISCHARGE INSTRUCTIONS   The drugs that you were given will stay in your system until tomorrow so for the next 24 hours you should not:  Drive an automobile Make any legal decisions Drink any alcoholic beverage   You may resume regular meals tomorrow.  Today it is better to start with liquids and gradually work up to solid foods.  You may eat anything you prefer, but it is better to start with liquids, then soup and crackers, and gradually work up to solid foods.   Please notify your doctor immediately if you have any unusual bleeding, trouble breathing, redness and pain at the surgery site, drainage, fever, or pain not relieved by medication.    Additional Instructions:        Please contact your physician with any problems or Same Day Surgery at [phone], Monday through Friday 6 am to 4 pm, or [facility] Main number at  [phone].  

## 2023-06-01 ENCOUNTER — Inpatient Hospital Stay: Payer: No Typology Code available for payment source

## 2023-06-01 ENCOUNTER — Encounter: Payer: Self-pay | Admitting: Oncology

## 2023-06-01 ENCOUNTER — Inpatient Hospital Stay (HOSPITAL_BASED_OUTPATIENT_CLINIC_OR_DEPARTMENT_OTHER): Payer: No Typology Code available for payment source | Admitting: Oncology

## 2023-06-01 DIAGNOSIS — Z803 Family history of malignant neoplasm of breast: Secondary | ICD-10-CM | POA: Diagnosis not present

## 2023-06-01 DIAGNOSIS — Z5111 Encounter for antineoplastic chemotherapy: Secondary | ICD-10-CM | POA: Diagnosis present

## 2023-06-01 DIAGNOSIS — D509 Iron deficiency anemia, unspecified: Secondary | ICD-10-CM | POA: Diagnosis not present

## 2023-06-01 DIAGNOSIS — C189 Malignant neoplasm of colon, unspecified: Secondary | ICD-10-CM

## 2023-06-01 DIAGNOSIS — Z87891 Personal history of nicotine dependence: Secondary | ICD-10-CM | POA: Diagnosis not present

## 2023-06-01 DIAGNOSIS — C787 Secondary malignant neoplasm of liver and intrahepatic bile duct: Secondary | ICD-10-CM

## 2023-06-01 LAB — CMP (CANCER CENTER ONLY)
ALT: 15 U/L (ref 0–44)
AST: 18 U/L (ref 15–41)
Albumin: 4 g/dL (ref 3.5–5.0)
Alkaline Phosphatase: 81 U/L (ref 38–126)
Anion gap: 5 (ref 5–15)
BUN: 14 mg/dL (ref 8–23)
CO2: 27 mmol/L (ref 22–32)
Calcium: 9 mg/dL (ref 8.9–10.3)
Chloride: 105 mmol/L (ref 98–111)
Creatinine: 0.57 mg/dL (ref 0.44–1.00)
GFR, Estimated: >60 mL/min (ref >60)
Glucose, Bld: 92 mg/dL (ref 70–99)
Potassium: 3.6 mmol/L (ref 3.5–5.1)
Sodium: 137 mmol/L (ref 135–145)
Total Bilirubin: 1.1 mg/dL (ref 0.3–1.2)
Total Protein: 7.3 g/dL (ref 6.5–8.1)

## 2023-06-01 LAB — CBC WITH DIFFERENTIAL (CANCER CENTER ONLY)
Abs Immature Granulocytes: 0.02 10*3/uL (ref 0.00–0.07)
Basophils Absolute: 0.1 10*3/uL (ref 0.0–0.1)
Basophils Relative: 1 %
Eosinophils Absolute: 0.1 10*3/uL (ref 0.0–0.5)
Eosinophils Relative: 1 %
HCT: 34.2 % — ABNORMAL LOW (ref 36.0–46.0)
Hemoglobin: 11.2 g/dL — ABNORMAL LOW (ref 12.0–15.0)
Immature Granulocytes: 0 %
Lymphocytes Relative: 20 %
Lymphs Abs: 1.5 10*3/uL (ref 0.7–4.0)
MCH: 30.1 pg (ref 26.0–34.0)
MCHC: 32.7 g/dL (ref 30.0–36.0)
MCV: 91.9 fL (ref 80.0–100.0)
Monocytes Absolute: 0.7 10*3/uL (ref 0.1–1.0)
Monocytes Relative: 9 %
Neutro Abs: 5 10*3/uL (ref 1.7–7.7)
Neutrophils Relative %: 69 %
Platelet Count: 237 10*3/uL (ref 150–400)
RBC: 3.72 MIL/uL — ABNORMAL LOW (ref 3.87–5.11)
RDW: 13.7 % (ref 11.5–15.5)
WBC Count: 7.3 10*3/uL (ref 4.0–10.5)
nRBC: 0 % (ref 0.0–0.2)

## 2023-06-01 MED ORDER — PALONOSETRON HCL INJECTION 0.25 MG/5ML
0.2500 mg | Freq: Once | INTRAVENOUS | Status: AC
  Administered 2023-06-01: 0.25 mg via INTRAVENOUS
  Filled 2023-06-01: qty 5

## 2023-06-01 MED ORDER — SODIUM CHLORIDE 0.9 % IV SOLN
2400.0000 mg/m2 | INTRAVENOUS | Status: DC
  Administered 2023-06-01: 3500 mg via INTRAVENOUS
  Filled 2023-06-01: qty 70

## 2023-06-01 MED ORDER — DEXTROSE 5 % IV SOLN
Freq: Once | INTRAVENOUS | Status: AC
  Filled 2023-06-01: qty 250

## 2023-06-01 MED ORDER — SODIUM CHLORIDE 0.9 % IV SOLN
10.0000 mg | Freq: Once | INTRAVENOUS | Status: AC
  Administered 2023-06-01: 10 mg via INTRAVENOUS
  Filled 2023-06-01: qty 10

## 2023-06-01 MED ORDER — OXALIPLATIN CHEMO INJECTION 100 MG/20ML
85.0000 mg/m2 | Freq: Once | INTRAVENOUS | Status: AC
  Administered 2023-06-01: 130 mg via INTRAVENOUS
  Filled 2023-06-01: qty 6

## 2023-06-01 MED ORDER — FLUOROURACIL CHEMO INJECTION 2.5 GM/50ML
400.0000 mg/m2 | Freq: Once | INTRAVENOUS | Status: AC
  Administered 2023-06-01: 600 mg via INTRAVENOUS
  Filled 2023-06-01: qty 12

## 2023-06-01 MED ORDER — LEUCOVORIN CALCIUM INJECTION 350 MG
400.0000 mg/m2 | Freq: Once | INTRAVENOUS | Status: AC
  Administered 2023-06-01: 620 mg via INTRAVENOUS
  Filled 2023-06-01: qty 31

## 2023-06-01 NOTE — Progress Notes (Signed)
Patient here today for follow up and initial treatment regarding colon cancer. Patient denies concerns today.

## 2023-06-01 NOTE — Patient Instructions (Signed)
Maritza Hosterman-Dr. Milinda Cave nurse [phone]

## 2023-06-01 NOTE — Patient Instructions (Signed)
[place] [facility]  Discharge Instructions: Thank you for choosing [facility] to provide your oncology and hematology care.  If you have a lab appointment with the Cancer Center, please go directly to the Cancer Center and check in at the registration area.  Wear comfortable clothing and clothing appropriate for easy access to any Portacath or PICC line.   We strive to give you quality time with your provider. You may need to reschedule your appointment if you arrive late (15 or more minutes).  Arriving late affects you and other patients whose appointments are after yours.  Also, if you miss three or more appointments without notifying the office, you may be dismissed from the clinic at the provider's discretion.      For prescription refill requests, have your pharmacy contact our office and allow 72 hours for refills to be completed.    Today you received the following chemotherapy and/or immunotherapy agents Oxaliplatin, Leucovorin, 5FU pump and push.      To help prevent nausea and vomiting after your treatment, we encourage you to take your nausea medication as directed.  BELOW ARE SYMPTOMS THAT SHOULD BE REPORTED IMMEDIATELY: *FEVER GREATER THAN 100.4 F (38 C) OR HIGHER *CHILLS OR SWEATING *NAUSEA AND VOMITING THAT IS NOT CONTROLLED WITH YOUR NAUSEA MEDICATION *UNUSUAL SHORTNESS OF BREATH *UNUSUAL BRUISING OR BLEEDING *URINARY PROBLEMS (pain or burning when urinating, or frequent urination) *BOWEL PROBLEMS (unusual diarrhea, constipation, pain near the anus) TENDERNESS IN MOUTH AND THROAT WITH OR WITHOUT PRESENCE OF ULCERS (sore throat, sores in mouth, or a toothache) UNUSUAL RASH, SWELLING OR PAIN  UNUSUAL VAGINAL DISCHARGE OR ITCHING   Items with * indicate a potential emergency and should be followed up as soon as possible or go to the Emergency Department if any problems should occur.  Please show the CHEMOTHERAPY ALERT CARD or  IMMUNOTHERAPY ALERT CARD at check-in to the Emergency Department and triage nurse.  Should you have questions after your visit or need to cancel or reschedule your appointment, please contact [place] [facility]  [phone] and follow the prompts.  Office hours are 8:00 a.m. to 4:30 p.m. Monday - Friday. Please note that voicemails left after 4:00 p.m. may not be returned until the following business day.  We are closed weekends and major holidays. You have access to a nurse at all times for urgent questions. Please call the main number to the clinic [phone] and follow the prompts.  For any non-urgent questions, you may also contact your provider using MyChart. We now offer e-Visits for anyone 33 and older to request care online for non-urgent symptoms. For details visit [email or web address].   Also download the MyChart app! Go to the app store, search "MyChart", open the app, select [place], and log in with your MyChart username and password.

## 2023-06-01 NOTE — Progress Notes (Signed)
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  INTERVAL HISTORY: Patient was previously seen by different provider and returns to clinic today for further evaluation and initiation of adjuvant FOLFOX.  She is anxious, but otherwise feels well.  She has no neurologic complaints.  She denies any recent fevers or illnesses.  She has a good appetite and denies weight loss.  She has no chest pain, shortness of breath, cough, or hemoptysis.  She denies any nausea, vomiting, constipation, or diarrhea.  She has no urinary complaints.  Patient offers no further specific complaints today.  REVIEW OF SYSTEMS:   Review of Systems  Constitutional: Negative.  Negative for fever, malaise/fatigue and weight loss.  Respiratory: Negative.  Negative for cough, hemoptysis and shortness of breath.   Cardiovascular: Negative.  Negative for chest pain and leg swelling.  Gastrointestinal: Negative.  Negative for abdominal pain, blood in stool and melena.  Genitourinary: Negative.  Negative for dysuria.  Musculoskeletal: Negative.  Negative for back pain.  Skin: Negative.  Negative for rash.  Neurological: Negative.  Negative for dizziness, focal weakness, weakness and headaches.  Psychiatric/Behavioral:  The patient is nervous/anxious.     As per HPI. Otherwise, a complete review of systems is negative.  PAST MEDICAL HISTORY: Past Medical History:  Diagnosis Date   Adenocarcinoma of colon (HCC) 04/03/2023   a.) 4 x 3 cm cecal mass identified on colonoscopy during workup for IDA. Bx 04/02/2020 --> moderately differentiated adenocarcinoma; MMR  stable.   Aortic atherosclerosis (HCC)    Arteriosclerotic cardiovascular disease    Carpal tunnel syndrome    Current smoker    Hiatal hernia    Hypercholesteremia    IDA (iron deficiency anemia)     PAST SURGICAL HISTORY: Past Surgical History:  Procedure Laterality Date   COLONOSCOPY     ESOPHAGOGASTRODUODENOSCOPY     PORTACATH PLACEMENT N/A 05/29/2023   Procedure: INSERTION PORT-A-CATH;  Surgeon: Carolan Shiver, MD;  Location: [facility] ORS;  Service: General;  Laterality: N/A;    FAMILY HISTORY: Family History  Problem Relation Age of Onset   Breast cancer Mother 45   Heart disease Father    Breast cancer Maternal Aunt 67    ADVANCED DIRECTIVES (Y/N):  N  HEALTH MAINTENANCE: Social History   Tobacco Use   Smoking status: Former    Current packs/day: 0.00    Average packs/day: 1 pack/day for 38.0 years (38.0 ttl pk-yrs)    Types: Cigarettes    Quit date: 04/03/2023    Years since quitting: 0.1   Smokeless tobacco: Never  Vaping Use   Vaping status: Never Used  Substance Use Topics   Alcohol use: No    Alcohol/week: 0.0 standard drinks of alcohol   Drug use: No     Colonoscopy:  PAP:  Bone density:  Lipid panel:  Allergies  Allergen Reactions   Sulfa Antibiotics Hives and Swelling    Current Outpatient Medications  Medication Sig Dispense Refill   atorvastatin (LIPITOR) 40 MG tablet Take 40 mg by mouth at bedtime.     dexamethasone (DECADRON) 4 MG tablet Take 2 tablets (8 mg total) by mouth daily. Start the day after chemotherapy for 2 days.  Take with food. 30 tablet 1   ferrous sulfate ER (SLOW FE) 142 (45 Fe) MG TBCR tablet Take 1 tablet by mouth 2 (two) times daily.     lidocaine-prilocaine (EMLA) cream Apply to affected area once 30 g 3   ondansetron (ZOFRAN) 4 MG tablet Take 1 tablet (4 mg total) by mouth daily as needed for nausea or vomiting. 10 tablet 1   ondansetron (ZOFRAN) 8 MG tablet Take 1 tablet (8 mg total) by mouth every 8 (eight)  hours as needed for nausea or vomiting. Start on the third day after chemotherapy. 30 tablet 1   prochlorperazine (COMPAZINE) 10 MG tablet Take 1 tablet (10 mg total) by mouth every 6 (six) hours as needed for nausea or vomiting. 30 tablet 1   HYDROcodone-acetaminophen (NORCO) 5-325 MG tablet Take 1 tablet by mouth every 4 (four) hours as needed for up to 3 days for moderate pain. (Patient not taking: Reported on 06/01/2023) 10 tablet 0   Omega-3 Fatty Acids (FISH OIL) 1000 MG CAPS Take 1 capsule by mouth daily. (Patient not taking: Reported on 06/01/2023)     oxyCODONE-acetaminophen (PERCOCET) 5-325 MG tablet Take 1 tablet by mouth every 4 (four) hours as needed for severe pain. (Patient not taking: Reported on 05/18/2023) 20 tablet 0   No current facility-administered medications for this visit.   Facility-Administered Medications Ordered in Other Visits  Medication Dose Route Frequency Provider Last Rate Last Admin   fluorouracil (ADRUCIL) 3,500 mg in sodium chloride 0.9 % 80 mL chemo infusion  2,400 mg/m2 (Treatment Plan Recorded) Intravenous 1 day or 1 dose Michaelyn Barter, MD   Infusion Verify at 06/01/23 1440    OBJECTIVE: Vitals:   06/01/23 0906  BP: 123/84  Pulse: 86  Resp: 18  Temp: 98.6 F (37 C)  SpO2: 100%     Body mass index is 19.32 kg/m.    ECOG FS:0 - Asymptomatic  General: Well-developed, well-nourished, no acute distress. Eyes: Pink conjunctiva, anicteric sclera. HEENT: Normocephalic, moist mucous membranes. Lungs: No audible wheezing or coughing. Heart: Regular rate and rhythm. Abdomen: Soft, nontender, no obvious distention. Musculoskeletal: No edema, cyanosis, or clubbing. Neuro: Alert, answering all questions appropriately. Cranial nerves grossly intact. Skin: No rashes or petechiae noted. Psych: Normal affect. Lymphatics: No cervical, calvicular, axillary or inguinal LAD.   LAB RESULTS:  Lab Results  Component Value Date   NA 137 06/01/2023   K 3.6  06/01/2023   CL 105 06/01/2023   CO2 27 06/01/2023   GLUCOSE 92 06/01/2023   BUN 14 06/01/2023   CREATININE 0.57 06/01/2023   CALCIUM 9.0 06/01/2023   PROT 7.3 06/01/2023   ALBUMIN 4.0 06/01/2023   AST 18 06/01/2023   ALT 15 06/01/2023   ALKPHOS 81 06/01/2023   BILITOT 1.1 06/01/2023   GFRNONAA >60 06/01/2023    Lab Results  Component Value Date   WBC 7.3 06/01/2023   NEUTROABS 5.0 06/01/2023   HGB 11.2 (L) 06/01/2023   HCT 34.2 (L) 06/01/2023   MCV 91.9 06/01/2023   PLT 237 06/01/2023     STUDIES: DG Chest Port 1 View  Result Date: 05/29/2023 CLINICAL DATA:  Post port a catheter placement EXAM: PORTABLE CHEST 1 VIEW COMPARISON:  None Available. FINDINGS: Unchanged cardiac silhouette and mediastinal contours. Interval placement of right jugular approach port a catheter with tip projected over the mid/distal SVC. No pneumothorax. Minimal grossly symmetric biapical pleural-parenchymal thickening. No discrete focal airspace opacities. No pleural effusion. No evidence of edema. No acute  osseous abnormalities. IMPRESSION: Interval placement of right jugular approach port a catheter with tip projected over the mid/distal SVC. No pneumothorax. Electronically Signed   By: Simonne Come M.D.   On: 05/29/2023 13:57   DG C-Arm 1-60 Min-No Report  Result Date: 05/29/2023 Fluoroscopy was utilized by the requesting physician.  No radiographic interpretation.   MR LIVER W WO CONTRAST  Result Date: 05/10/2023 CLINICAL DATA:  Follow-up hepatic lesion EXAM: MRI ABDOMEN WITHOUT AND WITH CONTRAST TECHNIQUE: Multiplanar multisequence MR imaging of the abdomen was performed both before and after the administration of intravenous contrast. CONTRAST:  5mL GADAVIST GADOBUTROL 1 MMOL/ML IV SOLN COMPARISON:  CT April 13, 2023 FINDINGS: Lower chest: No acute abnormality. Hepatobiliary: Low signal throughout the hepatic parenchyma less than that of skeletal muscle on T2 and inphase T1 pulse sequences  compatible with hepatic iron deposition. Slightly T2 hyperintense 2.4 cm segment VI hepatic lesion on image 19/12 demonstrates irregular peripheral postcontrast enhancement. No additional suspicious hepatic lesions identified. Gallbladder is unremarkable.  No biliary ductal dilation. Pancreas: No pancreatic ductal dilation or evidence of acute inflammation. Spleen:  No splenomegaly or focal splenic lesion. Adrenals/Urinary Tract: No suspicious adrenal mass. Kidneys demonstrate symmetric enhancement. Stomach/Bowel: No evidence of bowel obstruction. Vascular/Lymphatic: No pathologically enlarged lymph nodes identified. No abdominal aortic aneurysm demonstrated. Other:  None. Musculoskeletal: No suspicious bone lesions identified. IMPRESSION: 1. 2.4 cm segment VI hepatic lesion demonstrates imaging characteristics highly suspicious for a mucinous metastatic lesion. 2. Hepatic iron deposition. Electronically Signed   By: Maudry Mayhew M.D.   On: 05/10/2023 11:19    ASSESSMENT: Stage IV adenocarcinoma of the colon with isolated liver metastasis.  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  MRI of the liver on May 06, 2023 showed a 2.4 cm lesion in the liver which biopsy confirmed an isolated metastatic.  Consultation at [facility] recommended adjuvant chemotherapy with FOLFOX for 39-months followed by possible metastectomy of liver lesion.  CEA is pending at time of dictation.  Proceed with cycle 1 of FOLFOX today.  Return to clinic in 2 days for pump removal, in 1 week for laboratory work and further evaluation, and then in 2 weeks for further evaluation and consideration of cycle 2. Iron deficiency anemia: Improved.  Patient last received IV Venofer on May 04, 2023.  I spent a total of 30 minutes reviewing chart data, face-to-face evaluation with the patient, counseling and  coordination of care as detailed above.   Patient expressed understanding and was in agreement with this plan. She also understands that She can call clinic at any time with any questions, concerns, or complaints.    Cancer Staging  Colon adenocarcinoma [facility]) Staging form: Colon and Rectum, AJCC 8th Edition - Pathologic: pT3, pN2b, pM1 - Signed by Michaelyn Barter, MD on 05/18/2023 Stage prefix: Initial diagnosis Total positive nodes: 7 Total nodes examined: 60 Histologic grading system: 4 grade system Histologic grade (G): G2   Jeralyn Ruths, MD   06/01/2023 4:52 PM

## 2023-06-02 ENCOUNTER — Telehealth: Payer: Self-pay

## 2023-06-02 LAB — CEA: CEA: 2.8 ng/mL (ref 0.0–4.7)

## 2023-06-02 NOTE — Telephone Encounter (Signed)
Telephone call to patient for follow up after receiving first infusion.   No answer but left message stating we were calling to check on them.  Encouraged patient to call for any questions or concerns.   

## 2023-06-03 ENCOUNTER — Inpatient Hospital Stay: Payer: No Typology Code available for payment source

## 2023-06-03 VITALS — BP 145/67 | HR 74 | Resp 16

## 2023-06-03 DIAGNOSIS — Z5111 Encounter for antineoplastic chemotherapy: Secondary | ICD-10-CM | POA: Diagnosis not present

## 2023-06-03 DIAGNOSIS — C189 Malignant neoplasm of colon, unspecified: Secondary | ICD-10-CM

## 2023-06-03 MED ORDER — HEPARIN SOD (PORK) LOCK FLUSH 100 UNIT/ML IV SOLN
500.0000 [IU] | Freq: Once | INTRAVENOUS | Status: AC | PRN
  Administered 2023-06-03: 500 [IU]
  Filled 2023-06-03: qty 5

## 2023-06-05 ENCOUNTER — Encounter: Payer: Self-pay | Admitting: Oncology

## 2023-06-05 ENCOUNTER — Other Ambulatory Visit: Payer: Self-pay | Admitting: *Deleted

## 2023-06-05 DIAGNOSIS — C189 Malignant neoplasm of colon, unspecified: Secondary | ICD-10-CM

## 2023-06-08 ENCOUNTER — Inpatient Hospital Stay: Payer: No Typology Code available for payment source

## 2023-06-08 ENCOUNTER — Encounter: Payer: Self-pay | Admitting: Oncology

## 2023-06-08 ENCOUNTER — Inpatient Hospital Stay (HOSPITAL_BASED_OUTPATIENT_CLINIC_OR_DEPARTMENT_OTHER): Payer: No Typology Code available for payment source | Admitting: Oncology

## 2023-06-08 VITALS — BP 131/62 | HR 77 | Temp 97.2°F | Resp 16 | Ht 65.5 in | Wt 118.0 lb

## 2023-06-08 DIAGNOSIS — C189 Malignant neoplasm of colon, unspecified: Secondary | ICD-10-CM

## 2023-06-08 DIAGNOSIS — Z5111 Encounter for antineoplastic chemotherapy: Secondary | ICD-10-CM | POA: Diagnosis not present

## 2023-06-08 DIAGNOSIS — C787 Secondary malignant neoplasm of liver and intrahepatic bile duct: Secondary | ICD-10-CM

## 2023-06-08 LAB — CBC WITH DIFFERENTIAL/PLATELET
Abs Immature Granulocytes: 0.01 10*3/uL (ref 0.00–0.07)
Basophils Absolute: 0 10*3/uL (ref 0.0–0.1)
Basophils Relative: 0 %
Eosinophils Absolute: 0.4 10*3/uL (ref 0.0–0.5)
Eosinophils Relative: 7 %
HCT: 31.8 % — ABNORMAL LOW (ref 36.0–46.0)
Hemoglobin: 10.8 g/dL — ABNORMAL LOW (ref 12.0–15.0)
Immature Granulocytes: 0 %
Lymphocytes Relative: 30 %
Lymphs Abs: 1.5 10*3/uL (ref 0.7–4.0)
MCH: 30.9 pg (ref 26.0–34.0)
MCHC: 34 g/dL (ref 30.0–36.0)
MCV: 90.9 fL (ref 80.0–100.0)
Monocytes Absolute: 0.3 10*3/uL (ref 0.1–1.0)
Monocytes Relative: 5 %
Neutro Abs: 3 10*3/uL (ref 1.7–7.7)
Neutrophils Relative %: 58 %
Platelets: 188 10*3/uL (ref 150–400)
RBC: 3.5 MIL/uL — ABNORMAL LOW (ref 3.87–5.11)
RDW: 13.2 % (ref 11.5–15.5)
WBC: 5.2 10*3/uL (ref 4.0–10.5)
nRBC: 0 % (ref 0.0–0.2)

## 2023-06-08 LAB — CMP (CANCER CENTER ONLY)
ALT: 16 U/L (ref 0–44)
AST: 19 U/L (ref 15–41)
Albumin: 3.9 g/dL (ref 3.5–5.0)
Alkaline Phosphatase: 80 U/L (ref 38–126)
Anion gap: 7 (ref 5–15)
BUN: 18 mg/dL (ref 8–23)
CO2: 25 mmol/L (ref 22–32)
Calcium: 8.6 mg/dL — ABNORMAL LOW (ref 8.9–10.3)
Chloride: 104 mmol/L (ref 98–111)
Creatinine: 0.43 mg/dL — ABNORMAL LOW (ref 0.44–1.00)
GFR, Estimated: >60 mL/min (ref >60)
Glucose, Bld: 103 mg/dL — ABNORMAL HIGH (ref 70–99)
Potassium: 3.1 mmol/L — ABNORMAL LOW (ref 3.5–5.1)
Sodium: 136 mmol/L (ref 135–145)
Total Bilirubin: 0.5 mg/dL (ref 0.3–1.2)
Total Protein: 6.9 g/dL (ref 6.5–8.1)

## 2023-06-08 MED ORDER — HEPARIN SOD (PORK) LOCK FLUSH 100 UNIT/ML IV SOLN
500.0000 [IU] | Freq: Once | INTRAVENOUS | Status: AC
  Administered 2023-06-08: 500 [IU] via INTRAVENOUS
  Filled 2023-06-08: qty 5

## 2023-06-08 MED ORDER — SODIUM CHLORIDE 0.9% FLUSH
10.0000 mL | INTRAVENOUS | Status: DC | PRN
  Administered 2023-06-08: 10 mL via INTRAVENOUS
  Filled 2023-06-08: qty 10

## 2023-06-08 NOTE — Progress Notes (Unsigned)
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  INTERVAL HISTORY: Patient was previously seen by different provider and returns to clinic today for further evaluation and initiation of adjuvant FOLFOX.  She is anxious, but otherwise feels well.  She has no neurologic complaints.  She denies any recent fevers or illnesses.  She has a good appetite and denies weight loss.  She has no chest pain, shortness of breath, cough, or hemoptysis.  She denies any nausea, vomiting, constipation, or diarrhea.  She has no urinary complaints.  Patient offers no further specific complaints today.  REVIEW OF SYSTEMS:   Review of Systems  Constitutional: Negative.  Negative for fever, malaise/fatigue and weight loss.  Respiratory: Negative.  Negative for cough, hemoptysis and shortness of breath.   Cardiovascular: Negative.  Negative for chest pain and leg swelling.  Gastrointestinal: Negative.  Negative for abdominal pain, blood in stool and melena.  Genitourinary: Negative.  Negative for dysuria.  Musculoskeletal: Negative.  Negative for back pain.  Skin: Negative.  Negative for rash.  Neurological: Negative.  Negative for dizziness, focal weakness, weakness and headaches.  Psychiatric/Behavioral:  The patient is nervous/anxious.     As per HPI. Otherwise, a complete review of systems is negative.  PAST MEDICAL HISTORY: Past Medical History:  Diagnosis Date  . Adenocarcinoma of colon (HCC) 04/03/2023   a.) 4 x 3 cm cecal mass identified on colonoscopy during workup for IDA. Bx 04/02/2020 --> moderately differentiated adenocarcinoma; MMR  stable.  . Aortic atherosclerosis (HCC)   . Arteriosclerotic cardiovascular disease   . Carpal tunnel syndrome   . Current smoker   . Hiatal hernia   . Hypercholesteremia   . IDA (iron deficiency anemia)     PAST SURGICAL HISTORY: Past Surgical History:  Procedure Laterality Date  . COLONOSCOPY    . ESOPHAGOGASTRODUODENOSCOPY    . PORTACATH PLACEMENT N/A 05/29/2023   Procedure: INSERTION PORT-A-CATH;  Surgeon: Carolan Shiver, MD;  Location: [facility] ORS;  Service: General;  Laterality: N/A;    FAMILY HISTORY: Family History  Problem Relation Age of Onset  . Breast cancer Mother 34  . Heart disease Father   . Breast cancer Maternal Aunt 32    ADVANCED DIRECTIVES (Y/N):  N  HEALTH MAINTENANCE: Social History   Tobacco Use  . Smoking status: Former    Current packs/day: 0.00    Average packs/day: 1 pack/day for 38.0 years (38.0 ttl pk-yrs)    Types: Cigarettes    Quit date: 04/03/2023    Years since quitting: 0.1  . Smokeless tobacco: Never  Vaping Use  . Vaping status: Never Used  Substance Use Topics  . Alcohol use: No    Alcohol/week: 0.0 standard drinks of alcohol  . Drug use: No     Colonoscopy:  PAP:  Bone density:  Lipid panel:  Allergies  Allergen Reactions  . Sulfa Antibiotics Hives and Swelling    Current Outpatient Medications  Medication Sig Dispense Refill  . atorvastatin (LIPITOR) 40 MG tablet Take 40 mg by mouth at bedtime.    Marland Kitchen dexamethasone (DECADRON) 4 MG tablet Take 2 tablets (8 mg total) by mouth daily. Start the day after chemotherapy for 2 days.  Take with food. 30 tablet 1  . ferrous sulfate ER (SLOW FE) 142 (45 Fe) MG TBCR tablet Take 1 tablet by mouth 2 (two) times daily.    Marland Kitchen lidocaine-prilocaine (EMLA) cream Apply to affected area once 30 g 3  . Omega-3 Fatty Acids (FISH OIL) 1000 MG CAPS Take 1 capsule by mouth daily. (Patient not taking: Reported on 06/01/2023)    . ondansetron (ZOFRAN) 4 MG tablet Take 1 tablet (4 mg total)  by mouth daily as needed for nausea or vomiting. (Patient not taking: Reported on 06/08/2023) 10 tablet 1  . ondansetron (ZOFRAN) 8 MG tablet Take 1 tablet (8 mg total) by mouth every 8 (eight) hours as needed for nausea or vomiting. Start on the third day after chemotherapy. (Patient not taking: Reported on 06/08/2023) 30 tablet 1  . oxyCODONE-acetaminophen (PERCOCET) 5-325 MG tablet Take 1 tablet by mouth every 4 (four) hours as needed for severe pain. (Patient not taking: Reported on 05/18/2023) 20 tablet 0  . prochlorperazine (COMPAZINE) 10 MG tablet Take 1 tablet (10 mg total) by mouth every 6 (six) hours as needed for nausea or vomiting. (Patient not taking: Reported on 06/08/2023) 30 tablet 1   No current facility-administered medications for this visit.    OBJECTIVE: Vitals:   06/08/23 0850  BP: 131/62  Pulse: 77  Resp: 16  Temp: (!) 97.2 F (36.2 C)  SpO2: 100%     Body mass index is 19.34 kg/m.    ECOG FS:0 - Asymptomatic  General: Well-developed, well-nourished, no acute distress. Eyes: Pink conjunctiva, anicteric sclera. HEENT: Normocephalic, moist mucous membranes. Lungs: No audible wheezing or coughing. Heart: Regular rate and rhythm. Abdomen: Soft, nontender, no obvious distention. Musculoskeletal: No edema, cyanosis, or clubbing. Neuro: Alert, answering all questions appropriately. Cranial nerves grossly intact. Skin: No rashes or petechiae noted. Psych: Normal affect. Lymphatics: No cervical, calvicular, axillary or inguinal LAD.   LAB RESULTS:  Lab Results  Component Value Date   NA 137 06/01/2023   K 3.6 06/01/2023   CL 105 06/01/2023   CO2 27 06/01/2023   GLUCOSE 92 06/01/2023   BUN 14 06/01/2023   CREATININE 0.57 06/01/2023   CALCIUM 9.0 06/01/2023   PROT 7.3 06/01/2023   ALBUMIN 4.0 06/01/2023   AST 18 06/01/2023   ALT 15 06/01/2023   ALKPHOS 81 06/01/2023   BILITOT 1.1 06/01/2023   GFRNONAA >60 06/01/2023    Lab Results  Component Value  Date   WBC 5.2 06/08/2023   NEUTROABS 3.0 06/08/2023   HGB 10.8 (L) 06/08/2023   HCT 31.8 (L) 06/08/2023   MCV 90.9 06/08/2023   PLT 188 06/08/2023     STUDIES: DG Chest Port 1 View  Result Date: 05/29/2023 CLINICAL DATA:  Post port a catheter placement EXAM: PORTABLE CHEST 1 VIEW COMPARISON:  None Available. FINDINGS: Unchanged cardiac silhouette and mediastinal contours. Interval placement of right jugular approach port a catheter with tip projected over the mid/distal SVC. No pneumothorax. Minimal grossly symmetric biapical pleural-parenchymal thickening. No discrete focal airspace opacities. No pleural effusion. No evidence of edema. No acute osseous abnormalities. IMPRESSION: Interval placement of right jugular approach port a catheter with tip projected over the mid/distal SVC. No pneumothorax. Electronically Signed   By: Simonne Come M.D.   On: 05/29/2023 13:57   DG C-Arm 1-60 Min-No Report  Result Date: 05/29/2023 Fluoroscopy was utilized by the requesting physician.  No radiographic interpretation.    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  MRI of the liver on May 06, 2023 showed a 2.4 cm lesion in the liver which biopsy confirmed an isolated metastatic.  Consultation at [facility] recommended adjuvant chemotherapy with FOLFOX for 6-months followed by possible metastectomy of liver lesion.  CEA is pending at time of dictation.  Proceed with cycle 1 of FOLFOX today.  Return to clinic in 2 days for pump removal, in 1 week for laboratory work and further evaluation, and then in 2 weeks for further evaluation and consideration of cycle 2. Iron deficiency anemia: Improved.  Patient last received IV Venofer on May 04, 2023.  I spent a total of 30 minutes reviewing  chart data, face-to-face evaluation with the patient, counseling and coordination of care as detailed above.   Patient expressed understanding and was in agreement with this plan. She also understands that She can call clinic at any time with any questions, concerns, or complaints.    Cancer Staging  Colon adenocarcinoma [facility]) Staging form: Colon and Rectum, AJCC 8th Edition - Pathologic: pT3, pN2b, pM1 - Signed by Michaelyn Barter, MD on 05/18/2023 Stage prefix: Initial diagnosis Total positive nodes: 7 Total nodes examined: 60 Histologic grading system: 4 grade system Histologic grade (G): G2   Jeralyn Ruths, MD   06/08/2023 9:00 AM

## 2023-06-09 ENCOUNTER — Encounter: Payer: Self-pay | Admitting: Internal Medicine

## 2023-06-09 ENCOUNTER — Encounter: Payer: Self-pay | Admitting: Oncology

## 2023-06-15 ENCOUNTER — Ambulatory Visit: Payer: No Typology Code available for payment source

## 2023-06-15 ENCOUNTER — Other Ambulatory Visit: Payer: Self-pay | Admitting: Oncology

## 2023-06-15 ENCOUNTER — Inpatient Hospital Stay (HOSPITAL_BASED_OUTPATIENT_CLINIC_OR_DEPARTMENT_OTHER): Payer: No Typology Code available for payment source | Admitting: Oncology

## 2023-06-15 ENCOUNTER — Ambulatory Visit: Payer: No Typology Code available for payment source | Admitting: Internal Medicine

## 2023-06-15 ENCOUNTER — Inpatient Hospital Stay: Payer: No Typology Code available for payment source

## 2023-06-15 ENCOUNTER — Ambulatory Visit: Payer: No Typology Code available for payment source | Admitting: Oncology

## 2023-06-15 DIAGNOSIS — C189 Malignant neoplasm of colon, unspecified: Secondary | ICD-10-CM

## 2023-06-15 DIAGNOSIS — Z5111 Encounter for antineoplastic chemotherapy: Secondary | ICD-10-CM | POA: Diagnosis not present

## 2023-06-15 LAB — CBC WITH DIFFERENTIAL (CANCER CENTER ONLY)
Abs Immature Granulocytes: 0.01 10*3/uL (ref 0.00–0.07)
Basophils Absolute: 0 10*3/uL (ref 0.0–0.1)
Basophils Relative: 1 %
Eosinophils Absolute: 0.1 10*3/uL (ref 0.0–0.5)
Eosinophils Relative: 3 %
HCT: 31.2 % — ABNORMAL LOW (ref 36.0–46.0)
Hemoglobin: 10.5 g/dL — ABNORMAL LOW (ref 12.0–15.0)
Immature Granulocytes: 0 %
Lymphocytes Relative: 30 %
Lymphs Abs: 1.4 10*3/uL (ref 0.7–4.0)
MCH: 31.1 pg (ref 26.0–34.0)
MCHC: 33.7 g/dL (ref 30.0–36.0)
MCV: 92.3 fL (ref 80.0–100.0)
Monocytes Absolute: 0.4 10*3/uL (ref 0.1–1.0)
Monocytes Relative: 10 %
Neutro Abs: 2.5 10*3/uL (ref 1.7–7.7)
Neutrophils Relative %: 56 %
Platelet Count: 197 10*3/uL (ref 150–400)
RBC: 3.38 MIL/uL — ABNORMAL LOW (ref 3.87–5.11)
RDW: 14.1 % (ref 11.5–15.5)
WBC Count: 4.5 10*3/uL (ref 4.0–10.5)
nRBC: 0 % (ref 0.0–0.2)

## 2023-06-15 LAB — CMP (CANCER CENTER ONLY)
ALT: 24 U/L (ref 0–44)
AST: 23 U/L (ref 15–41)
Albumin: 3.8 g/dL (ref 3.5–5.0)
Alkaline Phosphatase: 82 U/L (ref 38–126)
Anion gap: 6 (ref 5–15)
BUN: 16 mg/dL (ref 8–23)
CO2: 26 mmol/L (ref 22–32)
Calcium: 8.6 mg/dL — ABNORMAL LOW (ref 8.9–10.3)
Chloride: 104 mmol/L (ref 98–111)
Creatinine: 0.53 mg/dL (ref 0.44–1.00)
GFR, Estimated: >60 mL/min (ref >60)
Glucose, Bld: 104 mg/dL — ABNORMAL HIGH (ref 70–99)
Potassium: 3.5 mmol/L (ref 3.5–5.1)
Sodium: 136 mmol/L (ref 135–145)
Total Bilirubin: 1 mg/dL (ref 0.3–1.2)
Total Protein: 6.5 g/dL (ref 6.5–8.1)

## 2023-06-15 MED ORDER — DEXAMETHASONE SODIUM PHOSPHATE 10 MG/ML IJ SOLN
10.0000 mg | Freq: Once | INTRAMUSCULAR | Status: AC
  Administered 2023-06-15: 10 mg via INTRAVENOUS
  Filled 2023-06-15: qty 1

## 2023-06-15 MED ORDER — FLUOROURACIL CHEMO INJECTION 2.5 GM/50ML
400.0000 mg/m2 | Freq: Once | INTRAVENOUS | Status: AC
  Administered 2023-06-15: 600 mg via INTRAVENOUS
  Filled 2023-06-15: qty 12

## 2023-06-15 MED ORDER — LEUCOVORIN CALCIUM INJECTION 350 MG
400.0000 mg/m2 | Freq: Once | INTRAVENOUS | Status: AC
  Administered 2023-06-15: 620 mg via INTRAVENOUS
  Filled 2023-06-15: qty 31

## 2023-06-15 MED ORDER — DEXTROSE 5 % IV SOLN
Freq: Once | INTRAVENOUS | Status: AC
  Filled 2023-06-15: qty 250

## 2023-06-15 MED ORDER — OXALIPLATIN CHEMO INJECTION 100 MG/20ML
85.0000 mg/m2 | Freq: Once | INTRAVENOUS | Status: AC
  Administered 2023-06-15: 130 mg via INTRAVENOUS
  Filled 2023-06-15: qty 6

## 2023-06-15 MED ORDER — SODIUM CHLORIDE 0.9 % IV SOLN
2400.0000 mg/m2 | INTRAVENOUS | Status: DC
  Administered 2023-06-15: 3500 mg via INTRAVENOUS
  Filled 2023-06-15: qty 70

## 2023-06-15 MED ORDER — PALONOSETRON HCL INJECTION 0.25 MG/5ML
0.2500 mg | Freq: Once | INTRAVENOUS | Status: AC
  Administered 2023-06-15: 0.25 mg via INTRAVENOUS
  Filled 2023-06-15: qty 5

## 2023-06-15 NOTE — Patient Instructions (Signed)
[place] [facility]  Discharge Instructions: Thank you for choosing [facility] to provide your oncology and hematology care.  If you have a lab appointment with the Cancer Center, please go directly to the Cancer Center and check in at the registration area.  Wear comfortable clothing and clothing appropriate for easy access to any Portacath or PICC line.   We strive to give you quality time with your provider. You may need to reschedule your appointment if you arrive late (15 or more minutes).  Arriving late affects you and other patients whose appointments are after yours.  Also, if you miss three or more appointments without notifying the office, you may be dismissed from the clinic at the provider's discretion.      For prescription refill requests, have your pharmacy contact our office and allow 72 hours for refills to be completed.    Today you received the following chemotherapy and/or immunotherapy agents Oxaliplatin, Leucovorin and Adrucil       To help prevent nausea and vomiting after your treatment, we encourage you to take your nausea medication as directed.  BELOW ARE SYMPTOMS THAT SHOULD BE REPORTED IMMEDIATELY: *FEVER GREATER THAN 100.4 F (38 C) OR HIGHER *CHILLS OR SWEATING *NAUSEA AND VOMITING THAT IS NOT CONTROLLED WITH YOUR NAUSEA MEDICATION *UNUSUAL SHORTNESS OF BREATH *UNUSUAL BRUISING OR BLEEDING *URINARY PROBLEMS (pain or burning when urinating, or frequent urination) *BOWEL PROBLEMS (unusual diarrhea, constipation, pain near the anus) TENDERNESS IN MOUTH AND THROAT WITH OR WITHOUT PRESENCE OF ULCERS (sore throat, sores in mouth, or a toothache) UNUSUAL RASH, SWELLING OR PAIN  UNUSUAL VAGINAL DISCHARGE OR ITCHING   Items with * indicate a potential emergency and should be followed up as soon as possible or go to the Emergency Department if any problems should occur.  Please show the CHEMOTHERAPY ALERT CARD or  IMMUNOTHERAPY ALERT CARD at check-in to the Emergency Department and triage nurse.  Should you have questions after your visit or need to cancel or reschedule your appointment, please contact [place] [facility]  [phone] and follow the prompts.  Office hours are 8:00 a.m. to 4:30 p.m. Monday - Friday. Please note that voicemails left after 4:00 p.m. may not be returned until the following business day.  We are closed weekends and major holidays. You have access to a nurse at all times for urgent questions. Please call the main number to the clinic [phone] and follow the prompts.  For any non-urgent questions, you may also contact your provider using MyChart. We now offer e-Visits for anyone 66 and older to request care online for non-urgent symptoms. For details visit [email or web address].   Also download the MyChart app! Go to the app store, search "MyChart", open the app, select [place], and log in with your MyChart username and password.

## 2023-06-15 NOTE — Progress Notes (Signed)
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  INTERVAL HISTORY: Patient returns to clinic today for repeat laboratory work, further evaluation, and consideration of cycle 2 of adjuvant FOLFOX.  She currently feels well and is asymptomatic.  She is tolerating her treatments without significant side effects. She has no neurologic complaints.  She denies any recent fevers or illnesses.  She has a good appetite and denies weight loss.  She has no chest pain, shortness of breath, cough, or hemoptysis.  She denies any nausea, vomiting, constipation, or diarrhea.  She has no melena or hematochezia.  She has no urinary complaints.  Patient offers has no specific complaints today.  REVIEW OF SYSTEMS:   Review of Systems  Constitutional: Negative.  Negative for fever, malaise/fatigue and weight loss.  Respiratory: Negative.  Negative for cough, hemoptysis and shortness of breath.   Cardiovascular: Negative.  Negative for chest pain and leg swelling.  Gastrointestinal: Negative.  Negative for abdominal pain, blood in stool and melena.  Genitourinary: Negative.  Negative for dysuria.  Musculoskeletal: Negative.  Negative for back pain.  Skin: Negative.  Negative for rash.  Neurological: Negative.  Negative for dizziness, focal weakness, weakness and headaches.  Psychiatric/Behavioral: Negative.  The patient is not nervous/anxious.     As per HPI. Otherwise, a complete review of systems is negative.  PAST MEDICAL HISTORY: Past Medical History:  Diagnosis Date   Adenocarcinoma of colon (HCC) 04/03/2023   a.) 4 x 3 cm cecal mass  identified on colonoscopy during workup for IDA. Bx 04/02/2020 --> moderately differentiated adenocarcinoma; MMR stable.   Aortic atherosclerosis (HCC)    Arteriosclerotic cardiovascular disease    Carpal tunnel syndrome    Current smoker    Hiatal hernia    Hypercholesteremia    IDA (iron deficiency anemia)     PAST SURGICAL HISTORY: Past Surgical History:  Procedure Laterality Date   COLONOSCOPY     ESOPHAGOGASTRODUODENOSCOPY     PORTACATH PLACEMENT N/A 05/29/2023   Procedure: INSERTION PORT-A-CATH;  Surgeon: Carolan Shiver, MD;  Location: [facility] ORS;  Service: General;  Laterality: N/A;    FAMILY HISTORY: Family History  Problem Relation Age of Onset   Breast cancer Mother 2   Heart disease Father    Breast cancer Maternal Aunt 35    ADVANCED DIRECTIVES (Y/N):  N  HEALTH MAINTENANCE: Social History   Tobacco Use   Smoking status: Former    Current packs/day: 0.00    Average packs/day: 1 pack/day for 38.0 years (38.0 ttl pk-yrs)    Types: Cigarettes    Quit date: 04/03/2023    Years since quitting: 0.2   Smokeless tobacco: Never  Vaping Use   Vaping status: Never Used  Substance Use Topics   Alcohol use: No    Alcohol/week: 0.0 standard drinks of alcohol   Drug use: No     Colonoscopy:  PAP:  Bone density:  Lipid panel:  Allergies  Allergen Reactions   Sulfa Antibiotics Hives and Swelling    Current Outpatient Medications  Medication Sig Dispense Refill   atorvastatin (LIPITOR) 40 MG tablet Take 40 mg by mouth at bedtime.     dexamethasone (DECADRON) 4 MG tablet  Take 2 tablets (8 mg total) by mouth daily. Start the day after chemotherapy for 2 days. Take with food. 30 tablet 1   ferrous sulfate ER (SLOW FE) 142 (45 Fe) MG TBCR tablet Take 1 tablet by mouth 2 (two) times daily.     lidocaine-prilocaine (EMLA) cream Apply to affected area once 30 g 3   Omega-3 Fatty Acids (FISH OIL) 1000 MG CAPS Take 1 capsule by mouth daily. (Patient not taking:  Reported on 06/01/2023)     ondansetron (ZOFRAN) 4 MG tablet Take 1 tablet (4 mg total) by mouth daily as needed for nausea or vomiting. (Patient not taking: Reported on 06/08/2023) 10 tablet 1   ondansetron (ZOFRAN) 8 MG tablet Take 1 tablet (8 mg total) by mouth every 8 (eight) hours as needed for nausea or vomiting. Start on the third day after chemotherapy. (Patient not taking: Reported on 06/08/2023) 30 tablet 1   oxyCODONE-acetaminophen (PERCOCET) 5-325 MG tablet Take 1 tablet by mouth every 4 (four) hours as needed for severe pain. (Patient not taking: Reported on 05/18/2023) 20 tablet 0   prochlorperazine (COMPAZINE) 10 MG tablet Take 1 tablet (10 mg total) by mouth every 6 (six) hours as needed for nausea or vomiting. (Patient not taking: Reported on 06/08/2023) 30 tablet 1   No current facility-administered medications for this visit.   Facility-Administered Medications Ordered in Other Visits  Medication Dose Route Frequency Provider Last Rate Last Admin   dexamethasone (DECADRON) injection 10 mg  10 mg Intravenous Once Michaelyn Barter, MD       dextrose 5 % solution   Intravenous Once Michaelyn Barter, MD       fluorouracil (ADRUCIL) 3,500 mg in sodium chloride 0.9 % 80 mL chemo infusion  2,400 mg/m2 (Treatment Plan Recorded) Intravenous 1 day or 1 dose Michaelyn Barter, MD       fluorouracil (ADRUCIL) chemo injection 600 mg  400 mg/m2 (Treatment Plan Recorded) Intravenous Once Michaelyn Barter, MD       leucovorin 620 mg in dextrose 5 % 250 mL infusion  400 mg/m2 (Treatment Plan Recorded) Intravenous Once Michaelyn Barter, MD       oxaliplatin (ELOXATIN) 130 mg in dextrose 5 % 500 mL chemo infusion  85 mg/m2 (Treatment Plan Recorded) Intravenous Once Michaelyn Barter, MD       palonosetron (ALOXI) injection 0.25 mg  0.25 mg Intravenous Once Michaelyn Barter, MD        OBJECTIVE: Vitals:   06/15/23 0908  BP: 135/65  Pulse: 75  Temp: 98.4 F (36.9 C)  SpO2: 100%     Body mass index is  19.34 kg/m.    ECOG FS:0 - Asymptomatic  General: Well-developed, well-nourished, no acute distress. Eyes: Pink conjunctiva, anicteric sclera. HEENT: Normocephalic, moist mucous membranes. Lungs: No audible wheezing or coughing. Heart: Regular rate and rhythm. Abdomen: Soft, nontender, no obvious distention. Musculoskeletal: No edema, cyanosis, or clubbing. Neuro: Alert, answering all questions appropriately. Cranial nerves grossly intact. Skin: No rashes or petechiae noted. Psych: Normal affect.  LAB RESULTS:  Lab Results  Component Value Date   NA 136 06/15/2023   K 3.5 06/15/2023   CL 104 06/15/2023   CO2 26 06/15/2023   GLUCOSE 104 (H) 06/15/2023   BUN 16 06/15/2023   CREATININE 0.53 06/15/2023   CALCIUM 8.6 (L) 06/15/2023   PROT 6.5 06/15/2023   ALBUMIN 3.8 06/15/2023   AST 23 06/15/2023   ALT 24 06/15/2023   ALKPHOS 82 06/15/2023   BILITOT 1.0 06/15/2023  GFRNONAA >60 06/15/2023    Lab Results  Component Value Date   WBC 4.5 06/15/2023   NEUTROABS 2.5 06/15/2023   HGB 10.5 (L) 06/15/2023   HCT 31.2 (L) 06/15/2023   MCV 92.3 06/15/2023   PLT 197 06/15/2023     STUDIES: DG Chest Port 1 View  Result Date: 05/29/2023 CLINICAL DATA:  Post port a catheter placement EXAM: PORTABLE CHEST 1 VIEW COMPARISON:  None Available. FINDINGS: Unchanged cardiac silhouette and mediastinal contours. Interval placement of right jugular approach port a catheter with tip projected over the mid/distal SVC. No pneumothorax. Minimal grossly symmetric biapical pleural-parenchymal thickening. No discrete focal airspace opacities. No pleural effusion. No evidence of edema. No acute osseous abnormalities. IMPRESSION: Interval placement of right jugular approach port a catheter with tip projected over the mid/distal SVC. No pneumothorax. Electronically Signed   By: Simonne Come M.D.   On: 05/29/2023 13:57   DG C-Arm 1-60 Min-No Report  Result Date: 05/29/2023 Fluoroscopy was utilized by  the requesting physician.  No radiographic interpretation.    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  MRI of the liver on May 06, 2023 showed a 2.4 cm lesion in the liver which biopsy confirmed an isolated metastatic lesion.  Consultation at [facility] recommended adjuvant chemotherapy with FOLFOX for 72-months followed by possible metastectomy of liver lesion.  Patient already has follow-up scheduled at [facility] in January 2025.  CEA is within normal limits at 2.8.  Proceed with 2 of treatment today.  Return to clinic in 2 days for pump removal and then in 2 weeks for further evaluation and consideration of cycle 3. Iron deficiency anemia: Hemoglobin decreased, but stable at 10.5.  Her most recent iron stores on May 17, 2021 were reported as normal.  Patient last received IV Venofer on May 04, 2023. Hypokalemia: Resolved.  I spent a total of 30 minutes reviewing chart data, face-to-face evaluation with the patient, counseling and coordination of care as detailed above.   Patient expressed understanding and was in agreement with this plan. She also understands that She can call clinic at any time with any questions, concerns, or complaints.    Cancer Staging  Colon adenocarcinoma [facility]) Staging form: Colon and Rectum, AJCC 8th Edition - Pathologic: pT3, pN2b, pM1 - Signed by Michaelyn Barter, MD on 05/18/2023 Stage prefix: Initial diagnosis Total positive nodes: 7 Total nodes examined: 60 Histologic grading system: 4 grade system Histologic grade (G): G2   Jeralyn Ruths, MD   06/15/2023 9:49 AM

## 2023-06-16 ENCOUNTER — Other Ambulatory Visit: Payer: Self-pay

## 2023-06-17 ENCOUNTER — Inpatient Hospital Stay: Payer: No Typology Code available for payment source

## 2023-06-17 VITALS — BP 138/70 | HR 80 | Temp 99.0°F | Resp 18

## 2023-06-17 DIAGNOSIS — Z5111 Encounter for antineoplastic chemotherapy: Secondary | ICD-10-CM | POA: Diagnosis not present

## 2023-06-17 DIAGNOSIS — C189 Malignant neoplasm of colon, unspecified: Secondary | ICD-10-CM

## 2023-06-17 MED ORDER — HEPARIN SOD (PORK) LOCK FLUSH 100 UNIT/ML IV SOLN
500.0000 [IU] | Freq: Once | INTRAVENOUS | Status: AC | PRN
  Administered 2023-06-17: 500 [IU]
  Filled 2023-06-17: qty 5

## 2023-06-17 MED ORDER — SODIUM CHLORIDE 0.9% FLUSH
10.0000 mL | INTRAVENOUS | Status: DC | PRN
  Administered 2023-06-17: 10 mL
  Filled 2023-06-17: qty 10

## 2023-06-25 ENCOUNTER — Other Ambulatory Visit: Payer: Self-pay

## 2023-06-26 ENCOUNTER — Encounter: Payer: Self-pay | Admitting: Oncology

## 2023-06-29 ENCOUNTER — Inpatient Hospital Stay: Payer: No Typology Code available for payment source

## 2023-06-29 ENCOUNTER — Encounter: Payer: Self-pay | Admitting: Oncology

## 2023-06-29 ENCOUNTER — Inpatient Hospital Stay (HOSPITAL_BASED_OUTPATIENT_CLINIC_OR_DEPARTMENT_OTHER): Payer: No Typology Code available for payment source | Admitting: Oncology

## 2023-06-29 ENCOUNTER — Inpatient Hospital Stay: Payer: No Typology Code available for payment source | Attending: Internal Medicine

## 2023-06-29 VITALS — BP 129/59 | HR 76 | Temp 98.5°F | Resp 18 | Ht 65.5 in | Wt 117.0 lb

## 2023-06-29 DIAGNOSIS — R7401 Elevation of levels of liver transaminase levels: Secondary | ICD-10-CM | POA: Diagnosis not present

## 2023-06-29 DIAGNOSIS — Z5111 Encounter for antineoplastic chemotherapy: Secondary | ICD-10-CM | POA: Diagnosis present

## 2023-06-29 DIAGNOSIS — Z803 Family history of malignant neoplasm of breast: Secondary | ICD-10-CM | POA: Diagnosis not present

## 2023-06-29 DIAGNOSIS — C189 Malignant neoplasm of colon, unspecified: Secondary | ICD-10-CM | POA: Diagnosis not present

## 2023-06-29 DIAGNOSIS — C787 Secondary malignant neoplasm of liver and intrahepatic bile duct: Secondary | ICD-10-CM | POA: Diagnosis not present

## 2023-06-29 DIAGNOSIS — D509 Iron deficiency anemia, unspecified: Secondary | ICD-10-CM | POA: Insufficient documentation

## 2023-06-29 DIAGNOSIS — G5603 Carpal tunnel syndrome, bilateral upper limbs: Secondary | ICD-10-CM | POA: Insufficient documentation

## 2023-06-29 DIAGNOSIS — E876 Hypokalemia: Secondary | ICD-10-CM | POA: Insufficient documentation

## 2023-06-29 DIAGNOSIS — Z87891 Personal history of nicotine dependence: Secondary | ICD-10-CM | POA: Diagnosis not present

## 2023-06-29 LAB — CBC WITH DIFFERENTIAL (CANCER CENTER ONLY)
Abs Immature Granulocytes: 0.02 10*3/uL (ref 0.00–0.07)
Basophils Absolute: 0.1 10*3/uL (ref 0.0–0.1)
Basophils Relative: 1 %
Eosinophils Absolute: 0.1 10*3/uL (ref 0.0–0.5)
Eosinophils Relative: 1 %
HCT: 33.2 % — ABNORMAL LOW (ref 36.0–46.0)
Hemoglobin: 11.2 g/dL — ABNORMAL LOW (ref 12.0–15.0)
Immature Granulocytes: 0 %
Lymphocytes Relative: 28 %
Lymphs Abs: 1.5 10*3/uL (ref 0.7–4.0)
MCH: 31.2 pg (ref 26.0–34.0)
MCHC: 33.7 g/dL (ref 30.0–36.0)
MCV: 92.5 fL (ref 80.0–100.0)
Monocytes Absolute: 0.6 10*3/uL (ref 0.1–1.0)
Monocytes Relative: 11 %
Neutro Abs: 3.2 10*3/uL (ref 1.7–7.7)
Neutrophils Relative %: 59 %
Platelet Count: 156 10*3/uL (ref 150–400)
RBC: 3.59 MIL/uL — ABNORMAL LOW (ref 3.87–5.11)
RDW: 14.6 % (ref 11.5–15.5)
WBC Count: 5.4 10*3/uL (ref 4.0–10.5)
nRBC: 0 % (ref 0.0–0.2)

## 2023-06-29 LAB — CMP (CANCER CENTER ONLY)
ALT: 41 U/L (ref 0–44)
AST: 31 U/L (ref 15–41)
Albumin: 4.1 g/dL (ref 3.5–5.0)
Alkaline Phosphatase: 99 U/L (ref 38–126)
Anion gap: 8 (ref 5–15)
BUN: 18 mg/dL (ref 8–23)
CO2: 27 mmol/L (ref 22–32)
Calcium: 9 mg/dL (ref 8.9–10.3)
Chloride: 101 mmol/L (ref 98–111)
Creatinine: 0.51 mg/dL (ref 0.44–1.00)
GFR, Estimated: >60 mL/min (ref >60)
Glucose, Bld: 107 mg/dL — ABNORMAL HIGH (ref 70–99)
Potassium: 3.5 mmol/L (ref 3.5–5.1)
Sodium: 136 mmol/L (ref 135–145)
Total Bilirubin: 1.1 mg/dL (ref <1.2)
Total Protein: 7 g/dL (ref 6.5–8.1)

## 2023-06-29 MED ORDER — LEUCOVORIN CALCIUM INJECTION 350 MG
400.0000 mg/m2 | Freq: Once | INTRAVENOUS | Status: AC
  Administered 2023-06-29: 620 mg via INTRAVENOUS
  Filled 2023-06-29: qty 31

## 2023-06-29 MED ORDER — OXALIPLATIN CHEMO INJECTION 100 MG/20ML
85.0000 mg/m2 | Freq: Once | INTRAVENOUS | Status: AC
  Administered 2023-06-29: 130 mg via INTRAVENOUS
  Filled 2023-06-29: qty 25.94

## 2023-06-29 MED ORDER — FLUOROURACIL CHEMO INJECTION 2.5 GM/50ML
400.0000 mg/m2 | Freq: Once | INTRAVENOUS | Status: AC
  Administered 2023-06-29: 600 mg via INTRAVENOUS
  Filled 2023-06-29: qty 12

## 2023-06-29 MED ORDER — FLUOROURACIL CHEMO INJECTION 5 GM/100ML
2400.0000 mg/m2 | INTRAVENOUS | Status: DC
  Administered 2023-06-29: 3500 mg via INTRAVENOUS
  Filled 2023-06-29: qty 70

## 2023-06-29 MED ORDER — PALONOSETRON HCL INJECTION 0.25 MG/5ML
0.2500 mg | Freq: Once | INTRAVENOUS | Status: AC
  Administered 2023-06-29: 0.25 mg via INTRAVENOUS
  Filled 2023-06-29: qty 5

## 2023-06-29 MED ORDER — DEXTROSE 5 % IV SOLN
Freq: Once | INTRAVENOUS | Status: AC
  Filled 2023-06-29: qty 250

## 2023-06-29 MED ORDER — DEXAMETHASONE SODIUM PHOSPHATE 10 MG/ML IJ SOLN
10.0000 mg | Freq: Once | INTRAMUSCULAR | Status: AC
  Administered 2023-06-29: 10 mg via INTRAVENOUS
  Filled 2023-06-29: qty 1

## 2023-06-29 NOTE — Progress Notes (Unsigned)
Patient states that she has been feeling well other than she had one vomit episode Saturday night and after she vomited she felt okay, thinks it could have been something she ate.

## 2023-06-29 NOTE — Progress Notes (Unsigned)
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  INTERVAL HISTORY: Patient returns to clinic today for repeat laboratory work, further evaluation, and consideration of cycle 3 of adjuvant FOLFOX.  She had 1 episode of vomiting on Saturday night, but this resolved without intervention.  She currently feels well and is asymptomatic.  She is tolerating her treatments without significant side effects.  She has no neurologic complaints.  She denies any recent fevers or illnesses.  She has a good appetite and denies weight loss.  She has no chest pain, shortness of breath, cough, or hemoptysis.  She denies any nausea, vomiting, constipation, or diarrhea.  She has no melena or hematochezia.  She has no urinary complaints.  Patient offers no further specific complaints today.    REVIEW OF SYSTEMS:   Review of Systems  Constitutional: Negative.  Negative for fever, malaise/fatigue and weight loss.  Respiratory: Negative.  Negative for cough, hemoptysis and shortness of breath.   Cardiovascular: Negative.  Negative for chest pain and leg swelling.  Gastrointestinal: Negative.  Negative for abdominal pain, blood in stool and melena.  Genitourinary: Negative.  Negative for dysuria.  Musculoskeletal: Negative.  Negative for back pain.  Skin: Negative.  Negative for rash.  Neurological: Negative.  Negative for dizziness, focal weakness, weakness and headaches.  Psychiatric/Behavioral: Negative.  The patient is not nervous/anxious.     As per HPI. Otherwise, a complete review of systems is negative.  PAST MEDICAL HISTORY: Past Medical  History:  Diagnosis Date   Adenocarcinoma of colon (HCC) 04/03/2023   a.) 4 x 3 cm cecal mass identified on colonoscopy during workup for IDA. Bx 04/02/2020 --> moderately differentiated adenocarcinoma; MMR stable.   Aortic atherosclerosis (HCC)    Arteriosclerotic cardiovascular disease    Carpal tunnel syndrome    Current smoker    Hiatal hernia    Hypercholesteremia    IDA (iron deficiency anemia)     PAST SURGICAL HISTORY: Past Surgical History:  Procedure Laterality Date   COLONOSCOPY     ESOPHAGOGASTRODUODENOSCOPY     PORTACATH PLACEMENT N/A 05/29/2023   Procedure: INSERTION PORT-A-CATH;  Surgeon: Carolan Shiver, MD;  Location: [facility] ORS;  Service: General;  Laterality: N/A;    FAMILY HISTORY: Family History  Problem Relation Age of Onset   Breast cancer Mother 39   Heart disease Father    Breast cancer Maternal Aunt 66    ADVANCED DIRECTIVES (Y/N):  N  HEALTH MAINTENANCE: Social History   Tobacco Use   Smoking status: Former    Current packs/day: 0.00    Average packs/day: 1 pack/day for 38.0 years (38.0 ttl pk-yrs)    Types: Cigarettes    Quit date: 04/03/2023    Years since quitting: 0.2   Smokeless tobacco: Never  Vaping Use   Vaping status: Never Used  Substance Use Topics   Alcohol use: No    Alcohol/week: 0.0 standard drinks of alcohol   Drug use: No     Colonoscopy:  PAP:  Bone density:  Lipid panel:  Allergies  Allergen Reactions   Sulfa Antibiotics Hives and Swelling    Current Outpatient Medications  Medication Sig Dispense Refill   atorvastatin (LIPITOR) 40  MG tablet Take 40 mg by mouth at bedtime.     dexamethasone (DECADRON) 4 MG tablet Take 2 tablets (8 mg total) by mouth daily. Start the day after chemotherapy for 2 days. Take with food. 30 tablet 1   ferrous sulfate ER (SLOW FE) 142 (45 Fe) MG TBCR tablet Take 1 tablet by mouth 2 (two) times daily.     lidocaine-prilocaine (EMLA) cream Apply to affected area once 30 g 3    Omega-3 Fatty Acids (FISH OIL) 1000 MG CAPS Take 1 capsule by mouth daily.     ondansetron (ZOFRAN) 4 MG tablet Take 1 tablet (4 mg total) by mouth daily as needed for nausea or vomiting. (Patient not taking: Reported on 06/08/2023) 10 tablet 1   ondansetron (ZOFRAN) 8 MG tablet Take 1 tablet (8 mg total) by mouth every 8 (eight) hours as needed for nausea or vomiting. Start on the third day after chemotherapy. (Patient not taking: Reported on 06/08/2023) 30 tablet 1   oxyCODONE-acetaminophen (PERCOCET) 5-325 MG tablet Take 1 tablet by mouth every 4 (four) hours as needed for severe pain. (Patient not taking: Reported on 05/18/2023) 20 tablet 0   prochlorperazine (COMPAZINE) 10 MG tablet Take 1 tablet (10 mg total) by mouth every 6 (six) hours as needed for nausea or vomiting. (Patient not taking: Reported on 06/08/2023) 30 tablet 1   No current facility-administered medications for this visit.    OBJECTIVE: Vitals:   06/29/23 0908  BP: (!) 129/59  Pulse: 76  Resp: 18  Temp: 98.5 F (36.9 C)  SpO2: 100%     Body mass index is 19.17 kg/m.    ECOG FS:0 - Asymptomatic  General: Well-developed, well-nourished, no acute distress. Eyes: Pink conjunctiva, anicteric sclera. HEENT: Normocephalic, moist mucous membranes. Lungs: No audible wheezing or coughing. Heart: Regular rate and rhythm. Abdomen: Soft, nontender, no obvious distention. Musculoskeletal: No edema, cyanosis, or clubbing. Neuro: Alert, answering all questions appropriately. Cranial nerves grossly intact. Skin: No rashes or petechiae noted. Psych: Normal affect.  LAB RESULTS:  Lab Results  Component Value Date   NA 136 06/29/2023   K 3.5 06/29/2023   CL 101 06/29/2023   CO2 27 06/29/2023   GLUCOSE 107 (H) 06/29/2023   BUN 18 06/29/2023   CREATININE 0.51 06/29/2023   CALCIUM 9.0 06/29/2023   PROT 7.0 06/29/2023   ALBUMIN 4.1 06/29/2023   AST 31 06/29/2023   ALT 41 06/29/2023   ALKPHOS 99 06/29/2023   BILITOT 1.1  06/29/2023   GFRNONAA >60 06/29/2023    Lab Results  Component Value Date   WBC 5.4 06/29/2023   NEUTROABS 3.2 06/29/2023   HGB 11.2 (L) 06/29/2023   HCT 33.2 (L) 06/29/2023   MCV 92.5 06/29/2023   PLT 156 06/29/2023     STUDIES: No results found.  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  MRI of the liver on May 06, 2023 showed a 2.4 cm lesion in the liver which biopsy confirmed an isolated metastatic lesion.  Consultation at [facility] recommended adjuvant chemotherapy with FOLFOX for 60-months followed by possible metastectomy of liver lesion.  Patient already has follow-up scheduled at [facility] in January 2025.  CEA is within normal limits at 2.8.  Proceed with cycle 3 of treatment today.  Return to clinic in 2 days for  pump removal and then in 2 weeks for further evaluation and consideration of cycle 4. Iron deficiency anemia: Hemoglobin improved to 11.2.  Her most recent iron stores on May 17, 2021 were reported as normal.  Patient last received IV Venofer on May 04, 2023. Hypokalemia: Resolved.  I spent a total of 30 minutes reviewing chart data, face-to-face evaluation with the patient, counseling and coordination of care as detailed above.    Patient expressed understanding and was in agreement with this plan. She also understands that She can call clinic at any time with any questions, concerns, or complaints.    Cancer Staging  Colon adenocarcinoma [facility]) Staging form: Colon and Rectum, AJCC 8th Edition - Pathologic: pT3, pN2b, pM1 - Signed by Michaelyn Barter, MD on 05/18/2023 Stage prefix: Initial diagnosis Total positive nodes: 7 Total nodes examined: 60 Histologic grading system: 4 grade system Histologic grade (G): G2   Jeralyn Ruths, MD   06/29/2023 9:37 AM

## 2023-06-30 ENCOUNTER — Encounter: Payer: Self-pay | Admitting: Oncology

## 2023-06-30 ENCOUNTER — Encounter: Payer: Self-pay | Admitting: Internal Medicine

## 2023-07-01 ENCOUNTER — Inpatient Hospital Stay: Payer: No Typology Code available for payment source

## 2023-07-01 VITALS — BP 143/61 | HR 74 | Temp 98.4°F | Resp 18

## 2023-07-01 DIAGNOSIS — Z5111 Encounter for antineoplastic chemotherapy: Secondary | ICD-10-CM | POA: Diagnosis not present

## 2023-07-01 DIAGNOSIS — C189 Malignant neoplasm of colon, unspecified: Secondary | ICD-10-CM

## 2023-07-01 MED ORDER — HEPARIN SOD (PORK) LOCK FLUSH 100 UNIT/ML IV SOLN
500.0000 [IU] | Freq: Once | INTRAVENOUS | Status: AC | PRN
  Administered 2023-07-01: 500 [IU]
  Filled 2023-07-01: qty 5

## 2023-07-01 MED ORDER — SODIUM CHLORIDE 0.9% FLUSH
10.0000 mL | INTRAVENOUS | Status: DC | PRN
Start: 2023-07-01 — End: 2023-07-01
  Administered 2023-07-01: 10 mL
  Filled 2023-07-01: qty 10

## 2023-07-10 ENCOUNTER — Other Ambulatory Visit: Payer: Self-pay | Admitting: Oncology

## 2023-07-10 DIAGNOSIS — C189 Malignant neoplasm of colon, unspecified: Secondary | ICD-10-CM

## 2023-07-13 ENCOUNTER — Other Ambulatory Visit: Payer: Self-pay | Admitting: Oncology

## 2023-07-13 ENCOUNTER — Inpatient Hospital Stay: Payer: No Typology Code available for payment source

## 2023-07-13 ENCOUNTER — Inpatient Hospital Stay (HOSPITAL_BASED_OUTPATIENT_CLINIC_OR_DEPARTMENT_OTHER): Payer: No Typology Code available for payment source | Admitting: Oncology

## 2023-07-13 ENCOUNTER — Encounter: Payer: Self-pay | Admitting: Oncology

## 2023-07-13 VITALS — BP 141/69 | HR 73 | Temp 96.6°F | Resp 16 | Ht 65.05 in | Wt 118.0 lb

## 2023-07-13 DIAGNOSIS — C189 Malignant neoplasm of colon, unspecified: Secondary | ICD-10-CM | POA: Diagnosis not present

## 2023-07-13 DIAGNOSIS — Z5111 Encounter for antineoplastic chemotherapy: Secondary | ICD-10-CM | POA: Diagnosis not present

## 2023-07-13 LAB — CMP (CANCER CENTER ONLY)
ALT: 60 U/L — ABNORMAL HIGH (ref 0–44)
AST: 42 U/L — ABNORMAL HIGH (ref 15–41)
Albumin: 3.8 g/dL (ref 3.5–5.0)
Alkaline Phosphatase: 83 U/L (ref 38–126)
Anion gap: 11 (ref 5–15)
BUN: 15 mg/dL (ref 8–23)
CO2: 26 mmol/L (ref 22–32)
Calcium: 8.6 mg/dL — ABNORMAL LOW (ref 8.9–10.3)
Chloride: 102 mmol/L (ref 98–111)
Creatinine: 0.67 mg/dL (ref 0.44–1.00)
GFR, Estimated: >60 mL/min (ref >60)
Glucose, Bld: 105 mg/dL — ABNORMAL HIGH (ref 70–99)
Potassium: 3.2 mmol/L — ABNORMAL LOW (ref 3.5–5.1)
Sodium: 139 mmol/L (ref 135–145)
Total Bilirubin: 1.4 mg/dL — ABNORMAL HIGH (ref <1.2)
Total Protein: 6.7 g/dL (ref 6.5–8.1)

## 2023-07-13 LAB — CBC WITH DIFFERENTIAL (CANCER CENTER ONLY)
Abs Immature Granulocytes: 0.01 10*3/uL (ref 0.00–0.07)
Basophils Absolute: 0 10*3/uL (ref 0.0–0.1)
Basophils Relative: 1 %
Eosinophils Absolute: 0.1 10*3/uL (ref 0.0–0.5)
Eosinophils Relative: 1 %
HCT: 31.6 % — ABNORMAL LOW (ref 36.0–46.0)
Hemoglobin: 10.9 g/dL — ABNORMAL LOW (ref 12.0–15.0)
Immature Granulocytes: 0 %
Lymphocytes Relative: 26 %
Lymphs Abs: 1.4 10*3/uL (ref 0.7–4.0)
MCH: 31.9 pg (ref 26.0–34.0)
MCHC: 34.5 g/dL (ref 30.0–36.0)
MCV: 92.4 fL (ref 80.0–100.0)
Monocytes Absolute: 0.6 10*3/uL (ref 0.1–1.0)
Monocytes Relative: 11 %
Neutro Abs: 3.4 10*3/uL (ref 1.7–7.7)
Neutrophils Relative %: 61 %
Platelet Count: 133 10*3/uL — ABNORMAL LOW (ref 150–400)
RBC: 3.42 MIL/uL — ABNORMAL LOW (ref 3.87–5.11)
RDW: 15.3 % (ref 11.5–15.5)
WBC Count: 5.5 10*3/uL (ref 4.0–10.5)
nRBC: 0 % (ref 0.0–0.2)

## 2023-07-13 MED ORDER — SODIUM CHLORIDE 0.9 % IV SOLN
2400.0000 mg/m2 | INTRAVENOUS | Status: DC
  Administered 2023-07-13: 3500 mg via INTRAVENOUS
  Filled 2023-07-13: qty 70

## 2023-07-13 MED ORDER — OXALIPLATIN CHEMO INJECTION 100 MG/20ML
85.0000 mg/m2 | Freq: Once | INTRAVENOUS | Status: AC
  Administered 2023-07-13: 130 mg via INTRAVENOUS
  Filled 2023-07-13: qty 22.1

## 2023-07-13 MED ORDER — DEXTROSE 5 % IV SOLN
Freq: Once | INTRAVENOUS | Status: AC
Start: 2023-07-13 — End: 2023-07-13
  Filled 2023-07-13: qty 250

## 2023-07-13 MED ORDER — DEXAMETHASONE SODIUM PHOSPHATE 10 MG/ML IJ SOLN
10.0000 mg | Freq: Once | INTRAMUSCULAR | Status: AC
  Administered 2023-07-13: 10 mg via INTRAVENOUS
  Filled 2023-07-13: qty 1

## 2023-07-13 MED ORDER — LEUCOVORIN CALCIUM INJECTION 350 MG
400.0000 mg/m2 | Freq: Once | INTRAVENOUS | Status: AC
  Administered 2023-07-13: 620 mg via INTRAVENOUS
  Filled 2023-07-13: qty 31

## 2023-07-13 MED ORDER — PALONOSETRON HCL INJECTION 0.25 MG/5ML
0.2500 mg | Freq: Once | INTRAVENOUS | Status: AC
  Administered 2023-07-13: 0.25 mg via INTRAVENOUS
  Filled 2023-07-13: qty 5

## 2023-07-13 MED ORDER — FLUOROURACIL CHEMO INJECTION 2.5 GM/50ML
400.0000 mg/m2 | Freq: Once | INTRAVENOUS | Status: AC
  Administered 2023-07-13: 600 mg via INTRAVENOUS
  Filled 2023-07-13: qty 12

## 2023-07-13 NOTE — Progress Notes (Signed)
[facility]  [phone] [phone]  ID: Macon Large OB: Oct 07, 1960  MR#: [id number]  QMV#:[id number]  Patient Care Team: Leim Fabry, MD as PCP - General (Family Medicine) Benita Gutter, RN as Oncology Nurse Navigator Orlie Dakin, Tollie Pizza, MD as Consulting Physician (Oncology)  CHIEF COMPLAINT: Stage IV adenocarcinoma of the colon with isolated liver metastasis.  INTERVAL HISTORY: Patient returns to clinic today for repeat laboratory work, further evaluation, and consideration of cycle 4 of adjuvant FOLFOX.  She had an episode of dizziness that resolved without intervention after her last treatment, but otherwise has felt well.  She has no neurologic complaints.  She denies any recent fevers or illnesses.  She has a good appetite and denies weight loss.  She has no chest pain, shortness of breath, cough, or hemoptysis.  She denies any nausea, vomiting, constipation, or diarrhea.  She has no melena or hematochezia.  She has no urinary complaints.  Patient offers no further specific complaints today.  REVIEW OF SYSTEMS:   Review of Systems  Constitutional: Negative.  Negative for fever, malaise/fatigue and weight loss.  Respiratory: Negative.  Negative for cough, hemoptysis and shortness of breath.   Cardiovascular: Negative.  Negative for chest pain and leg swelling.  Gastrointestinal: Negative.  Negative for abdominal pain, blood in stool and melena.  Genitourinary: Negative.  Negative for dysuria.  Musculoskeletal: Negative.  Negative for back pain.  Skin: Negative.  Negative for rash.  Neurological: Negative.  Negative for dizziness, focal weakness, weakness and headaches.  Psychiatric/Behavioral: Negative.  The patient is not nervous/anxious.     As per HPI. Otherwise, a complete review of systems is negative.  PAST MEDICAL HISTORY: Past Medical History:  Diagnosis Date   Adenocarcinoma of colon (HCC) 04/03/2023   a.) 4 x 3 cm  cecal mass identified on colonoscopy during workup for IDA. Bx 04/02/2020 --> moderately differentiated adenocarcinoma; MMR stable.   Aortic atherosclerosis (HCC)    Arteriosclerotic cardiovascular disease    Carpal tunnel syndrome    Current smoker    Hiatal hernia    Hypercholesteremia    IDA (iron deficiency anemia)     PAST SURGICAL HISTORY: Past Surgical History:  Procedure Laterality Date   COLONOSCOPY     ESOPHAGOGASTRODUODENOSCOPY     PORTACATH PLACEMENT N/A 05/29/2023   Procedure: INSERTION PORT-A-CATH;  Surgeon: Carolan Shiver, MD;  Location: [facility] ORS;  Service: General;  Laterality: N/A;    FAMILY HISTORY: Family History  Problem Relation Age of Onset   Breast cancer Mother 88   Heart disease Father    Breast cancer Maternal Aunt 58    ADVANCED DIRECTIVES (Y/N):  N  HEALTH MAINTENANCE: Social History   Tobacco Use   Smoking status: Former    Current packs/day: 0.00    Average packs/day: 1 pack/day for 38.0 years (38.0 ttl pk-yrs)    Types: Cigarettes    Quit date: 04/03/2023    Years since quitting: 0.2   Smokeless tobacco: Never  Vaping Use   Vaping status: Never Used  Substance Use Topics   Alcohol use: No    Alcohol/week: 0.0 standard drinks of alcohol   Drug use: No     Colonoscopy:  PAP:  Bone density:  Lipid panel:  Allergies  Allergen Reactions   Sulfa Antibiotics Hives and Swelling    Current Outpatient Medications  Medication Sig Dispense Refill   atorvastatin (LIPITOR) 40 MG tablet Take 40 mg by mouth at bedtime.     dexamethasone (DECADRON)  4 MG tablet Take 2 tablets (8 mg total) by mouth daily. Start the day after chemotherapy for 2 days. Take with food. 30 tablet 1   lidocaine-prilocaine (EMLA) cream Apply to affected area once 30 g 3   ferrous sulfate ER (SLOW FE) 142 (45 Fe) MG TBCR tablet Take 1 tablet by mouth 2 (two) times daily. (Patient not taking: Reported on 07/13/2023)     Omega-3 Fatty Acids (FISH OIL) 1000 MG  CAPS Take 1 capsule by mouth daily. (Patient not taking: Reported on 07/13/2023)     ondansetron (ZOFRAN) 4 MG tablet Take 1 tablet (4 mg total) by mouth daily as needed for nausea or vomiting. (Patient not taking: Reported on 06/08/2023) 10 tablet 1   ondansetron (ZOFRAN) 8 MG tablet Take 1 tablet (8 mg total) by mouth every 8 (eight) hours as needed for nausea or vomiting. Start on the third day after chemotherapy. (Patient not taking: Reported on 06/08/2023) 30 tablet 1   oxyCODONE-acetaminophen (PERCOCET) 5-325 MG tablet Take 1 tablet by mouth every 4 (four) hours as needed for severe pain. (Patient not taking: Reported on 05/18/2023) 20 tablet 0   prochlorperazine (COMPAZINE) 10 MG tablet Take 1 tablet (10 mg total) by mouth every 6 (six) hours as needed for nausea or vomiting. (Patient not taking: Reported on 06/08/2023) 30 tablet 1   No current facility-administered medications for this visit.   Facility-Administered Medications Ordered in Other Visits  Medication Dose Route Frequency Provider Last Rate Last Admin   fluorouracil (ADRUCIL) 3,500 mg in sodium chloride 0.9 % 80 mL chemo infusion  2,400 mg/m2 (Treatment Plan Recorded) Intravenous 1 day or 1 dose Orlie Dakin, Tollie Pizza, MD       fluorouracil (ADRUCIL) chemo injection 600 mg  400 mg/m2 (Treatment Plan Recorded) Intravenous Once Jeralyn Ruths, MD       leucovorin 620 mg in dextrose 5 % 250 mL infusion  400 mg/m2 (Treatment Plan Recorded) Intravenous Once Jeralyn Ruths, MD 141 mL/hr at 07/13/23 1114 620 mg at 07/13/23 1114   oxaliplatin (ELOXATIN) 130 mg in dextrose 5 % 500 mL chemo infusion  85 mg/m2 (Treatment Plan Recorded) Intravenous Once Jeralyn Ruths, MD 263 mL/hr at 07/13/23 1115 130 mg at 07/13/23 1115    OBJECTIVE: Vitals:   07/13/23 0915  BP: (!) 141/69  Pulse: 73  Resp: 16  Temp: (!) 96.6 F (35.9 C)  SpO2: 100%     Body mass index is 19.61 kg/m.    ECOG FS:0 - Asymptomatic  General:  Well-developed, well-nourished, no acute distress. Eyes: Pink conjunctiva, anicteric sclera. HEENT: Normocephalic, moist mucous membranes. Lungs: No audible wheezing or coughing. Heart: Regular rate and rhythm. Abdomen: Soft, nontender, no obvious distention. Musculoskeletal: No edema, cyanosis, or clubbing. Neuro: Alert, answering all questions appropriately. Cranial nerves grossly intact. Skin: No rashes or petechiae noted. Psych: Normal affect.  LAB RESULTS:  Lab Results  Component Value Date   NA 139 07/13/2023   K 3.2 (L) 07/13/2023   CL 102 07/13/2023   CO2 26 07/13/2023   GLUCOSE 105 (H) 07/13/2023   BUN 15 07/13/2023   CREATININE 0.67 07/13/2023   CALCIUM 8.6 (L) 07/13/2023   PROT 6.7 07/13/2023   ALBUMIN 3.8 07/13/2023   AST 42 (H) 07/13/2023   ALT 60 (H) 07/13/2023   ALKPHOS 83 07/13/2023   BILITOT 1.4 (H) 07/13/2023   GFRNONAA >60 07/13/2023    Lab Results  Component Value Date   WBC 5.5 07/13/2023   NEUTROABS  3.4 07/13/2023   HGB 10.9 (L) 07/13/2023   HCT 31.6 (L) 07/13/2023   MCV 92.4 07/13/2023   PLT 133 (L) 07/13/2023     STUDIES: No results found.  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  MRI of the liver on May 06, 2023 showed a 2.4 cm lesion in the liver which biopsy confirmed an isolated metastatic lesion.  Consultation at [facility] recommended adjuvant chemotherapy with FOLFOX for 7-months followed by possible metastectomy of liver lesion.  Patient already has follow-up scheduled at [facility] in January 2025.  CEA is within normal limits at 2.8.  Proceed with cycle 4 of treatment today.  Return to clinic in 2 days for pump removal and then in 2 weeks for further evaluation and consideration of cycle 5. Iron deficiency anemia:  Hemoglobin is trended down slightly to 10.9.  Her most recent iron stores on May 17, 2021 were reported as normal.  Patient last received IV Venofer on May 04, 2023. Hypokalemia: Mild.  Patient was given dietary changes. Transaminitis: Mild, monitor. Hyperbilirubinemia: Mild, monitor.    Patient expressed understanding and was in agreement with this plan. She also understands that She can call clinic at any time with any questions, concerns, or complaints.    Cancer Staging  Colon adenocarcinoma [facility]) Staging form: Colon and Rectum, AJCC 8th Edition - Pathologic: pT3, pN2b, pM1 - Signed by Michaelyn Barter, MD on 05/18/2023 Stage prefix: Initial diagnosis Total positive nodes: 7 Total nodes examined: 60 Histologic grading system: 4 grade system Histologic grade (G): G2   Jeralyn Ruths, MD   07/13/2023 11:55 AM

## 2023-07-13 NOTE — Patient Instructions (Signed)
[facility]  Discharge Instructions: Thank you for choosing [facility] to provide your oncology and hematology care.  If you have a lab appointment with the Cancer Center, please go directly to the Cancer Center and check in at the registration area.  Wear comfortable clothing and clothing appropriate for easy access to any Portacath or PICC line.   We strive to give you quality time with your provider. You may need to reschedule your appointment if you arrive late (15 or more minutes).  Arriving late affects you and other patients whose appointments are after yours.  Also, if you miss three or more appointments without notifying the office, you may be dismissed from the clinic at the provider's discretion.      For prescription refill requests, have your pharmacy contact our office and allow 72 hours for refills to be completed.    To help prevent nausea and vomiting after your treatment, we encourage you to take your nausea medication as directed.  BELOW ARE SYMPTOMS THAT SHOULD BE REPORTED IMMEDIATELY: *FEVER GREATER THAN 100.4 F (38 C) OR HIGHER *CHILLS OR SWEATING *NAUSEA AND VOMITING THAT IS NOT CONTROLLED WITH YOUR NAUSEA MEDICATION *UNUSUAL SHORTNESS OF BREATH *UNUSUAL BRUISING OR BLEEDING *URINARY PROBLEMS (pain or burning when urinating, or frequent urination) *BOWEL PROBLEMS (unusual diarrhea, constipation, pain near the anus) TENDERNESS IN MOUTH AND THROAT WITH OR WITHOUT PRESENCE OF ULCERS (sore throat, sores in mouth, or a toothache) UNUSUAL RASH, SWELLING OR PAIN  UNUSUAL VAGINAL DISCHARGE OR ITCHING   Items with * indicate a potential emergency and should be followed up as soon as possible or go to the Emergency Department if any problems should occur.  Please show the CHEMOTHERAPY ALERT CARD or IMMUNOTHERAPY ALERT CARD at check-in to the Emergency Department and triage nurse.  Should you have  questions after your visit or need to cancel or reschedule your appointment, please contact [facility] - A DEPT OF [facility]  [phone] and follow the prompts.  Office hours are 8:00 a.m. to 4:30 p.m. Monday - Friday. Please note that voicemails left after 4:00 p.m. may not be returned until the following business day.  We are closed weekends and major holidays. You have access to a nurse at all times for urgent questions. Please call the main number to the clinic [phone] and follow the prompts.  For any non-urgent questions, you may also contact your provider using MyChart. We now offer e-Visits for anyone 39 and older to request care online for non-urgent symptoms. For details visit [email or web address].   Also download the MyChart app! Go to the app store, search "MyChart", open the app, select [place], and log in with your MyChart username and password.

## 2023-07-13 NOTE — Progress Notes (Signed)
Mentioned dizziness after last treatment.

## 2023-07-14 ENCOUNTER — Other Ambulatory Visit: Payer: Self-pay

## 2023-07-15 ENCOUNTER — Other Ambulatory Visit: Payer: Self-pay

## 2023-07-15 ENCOUNTER — Inpatient Hospital Stay: Payer: No Typology Code available for payment source

## 2023-07-15 VITALS — BP 150/73 | HR 74 | Resp 18

## 2023-07-15 DIAGNOSIS — C189 Malignant neoplasm of colon, unspecified: Secondary | ICD-10-CM

## 2023-07-15 DIAGNOSIS — Z5111 Encounter for antineoplastic chemotherapy: Secondary | ICD-10-CM | POA: Diagnosis not present

## 2023-07-15 MED ORDER — HEPARIN SOD (PORK) LOCK FLUSH 100 UNIT/ML IV SOLN
500.0000 [IU] | Freq: Once | INTRAVENOUS | Status: AC | PRN
Start: 2023-07-15 — End: 2023-07-15
  Administered 2023-07-15: 500 [IU]
  Filled 2023-07-15: qty 5

## 2023-07-15 MED ORDER — SODIUM CHLORIDE 0.9% FLUSH
10.0000 mL | INTRAVENOUS | Status: DC | PRN
  Administered 2023-07-15: 10 mL
  Filled 2023-07-15: qty 10

## 2023-07-27 ENCOUNTER — Inpatient Hospital Stay: Payer: No Typology Code available for payment source | Attending: Internal Medicine

## 2023-07-27 ENCOUNTER — Encounter: Payer: Self-pay | Admitting: Oncology

## 2023-07-27 ENCOUNTER — Inpatient Hospital Stay (HOSPITAL_BASED_OUTPATIENT_CLINIC_OR_DEPARTMENT_OTHER): Payer: No Typology Code available for payment source | Admitting: Oncology

## 2023-07-27 ENCOUNTER — Inpatient Hospital Stay: Payer: No Typology Code available for payment source

## 2023-07-27 VITALS — BP 111/69 | HR 78 | Temp 96.3°F | Resp 16 | Ht 65.05 in | Wt 117.0 lb

## 2023-07-27 DIAGNOSIS — Z803 Family history of malignant neoplasm of breast: Secondary | ICD-10-CM | POA: Insufficient documentation

## 2023-07-27 DIAGNOSIS — C189 Malignant neoplasm of colon, unspecified: Secondary | ICD-10-CM

## 2023-07-27 DIAGNOSIS — D509 Iron deficiency anemia, unspecified: Secondary | ICD-10-CM | POA: Insufficient documentation

## 2023-07-27 DIAGNOSIS — Z5111 Encounter for antineoplastic chemotherapy: Secondary | ICD-10-CM | POA: Insufficient documentation

## 2023-07-27 DIAGNOSIS — R7401 Elevation of levels of liver transaminase levels: Secondary | ICD-10-CM | POA: Diagnosis not present

## 2023-07-27 DIAGNOSIS — C787 Secondary malignant neoplasm of liver and intrahepatic bile duct: Secondary | ICD-10-CM

## 2023-07-27 DIAGNOSIS — Z87891 Personal history of nicotine dependence: Secondary | ICD-10-CM | POA: Insufficient documentation

## 2023-07-27 LAB — CBC WITH DIFFERENTIAL (CANCER CENTER ONLY)
Abs Immature Granulocytes: 0.02 10*3/uL (ref 0.00–0.07)
Basophils Absolute: 0 10*3/uL (ref 0.0–0.1)
Basophils Relative: 1 %
Eosinophils Absolute: 0.1 10*3/uL (ref 0.0–0.5)
Eosinophils Relative: 2 %
HCT: 32.8 % — ABNORMAL LOW (ref 36.0–46.0)
Hemoglobin: 11.2 g/dL — ABNORMAL LOW (ref 12.0–15.0)
Immature Granulocytes: 0 %
Lymphocytes Relative: 26 %
Lymphs Abs: 1.2 10*3/uL (ref 0.7–4.0)
MCH: 31.7 pg (ref 26.0–34.0)
MCHC: 34.1 g/dL (ref 30.0–36.0)
MCV: 92.9 fL (ref 80.0–100.0)
Monocytes Absolute: 0.7 10*3/uL (ref 0.1–1.0)
Monocytes Relative: 14 %
Neutro Abs: 2.6 10*3/uL (ref 1.7–7.7)
Neutrophils Relative %: 57 %
Platelet Count: 123 10*3/uL — ABNORMAL LOW (ref 150–400)
RBC: 3.53 MIL/uL — ABNORMAL LOW (ref 3.87–5.11)
RDW: 16.2 % — ABNORMAL HIGH (ref 11.5–15.5)
WBC Count: 4.6 10*3/uL (ref 4.0–10.5)
nRBC: 0 % (ref 0.0–0.2)

## 2023-07-27 LAB — CMP (CANCER CENTER ONLY)
ALT: 51 U/L — ABNORMAL HIGH (ref 0–44)
AST: 44 U/L — ABNORMAL HIGH (ref 15–41)
Albumin: 4 g/dL (ref 3.5–5.0)
Alkaline Phosphatase: 86 U/L (ref 38–126)
Anion gap: 8 (ref 5–15)
BUN: 21 mg/dL (ref 8–23)
CO2: 25 mmol/L (ref 22–32)
Calcium: 9.1 mg/dL (ref 8.9–10.3)
Chloride: 104 mmol/L (ref 98–111)
Creatinine: 0.63 mg/dL (ref 0.44–1.00)
GFR, Estimated: >60 mL/min (ref >60)
Glucose, Bld: 115 mg/dL — ABNORMAL HIGH (ref 70–99)
Potassium: 3.5 mmol/L (ref 3.5–5.1)
Sodium: 137 mmol/L (ref 135–145)
Total Bilirubin: 1.7 mg/dL — ABNORMAL HIGH (ref <1.2)
Total Protein: 7.2 g/dL (ref 6.5–8.1)

## 2023-07-27 MED ORDER — DEXTROSE 5 % IV SOLN
400.0000 mg/m2 | Freq: Once | INTRAVENOUS | Status: AC
  Administered 2023-07-27: 620 mg via INTRAVENOUS
  Filled 2023-07-27: qty 31

## 2023-07-27 MED ORDER — OXALIPLATIN CHEMO INJECTION 100 MG/20ML
85.0000 mg/m2 | Freq: Once | INTRAVENOUS | Status: AC
  Administered 2023-07-27: 130 mg via INTRAVENOUS
  Filled 2023-07-27: qty 26

## 2023-07-27 MED ORDER — DEXTROSE 5 % IV SOLN
Freq: Once | INTRAVENOUS | Status: AC
Start: 2023-07-27 — End: 2023-07-27
  Filled 2023-07-27: qty 250

## 2023-07-27 MED ORDER — DEXAMETHASONE SODIUM PHOSPHATE 10 MG/ML IJ SOLN
10.0000 mg | Freq: Once | INTRAMUSCULAR | Status: AC
  Administered 2023-07-27: 10 mg via INTRAVENOUS
  Filled 2023-07-27: qty 1

## 2023-07-27 MED ORDER — SODIUM CHLORIDE 0.9 % IV SOLN
2400.0000 mg/m2 | INTRAVENOUS | Status: DC
  Administered 2023-07-27: 3500 mg via INTRAVENOUS
  Filled 2023-07-27: qty 70

## 2023-07-27 MED ORDER — PALONOSETRON HCL INJECTION 0.25 MG/5ML
0.2500 mg | Freq: Once | INTRAVENOUS | Status: AC
  Administered 2023-07-27: 0.25 mg via INTRAVENOUS
  Filled 2023-07-27: qty 5

## 2023-07-27 MED ORDER — FLUOROURACIL CHEMO INJECTION 2.5 GM/50ML
400.0000 mg/m2 | Freq: Once | INTRAVENOUS | Status: AC
  Administered 2023-07-27: 600 mg via INTRAVENOUS
  Filled 2023-07-27: qty 12

## 2023-07-27 NOTE — Progress Notes (Signed)
[facility]  Telephone:(336) [phone] [phone]) [phone]  ID: Macon Large OB: August 07, 1961  MR#: [id number]  QIO#:[id number]  Patient Care Team: Leim Fabry, MD as PCP - General (Family Medicine) Benita Gutter, RN as Oncology Nurse Navigator Orlie Dakin, Tollie Pizza, MD as Consulting Physician (Oncology)  CHIEF COMPLAINT: Stage IV adenocarcinoma of the colon with isolated liver metastasis.  INTERVAL HISTORY: Patient returns to clinic today for repeat laboratory work, further evaluation, and consideration of cycle 5 of adjuvant FOLFOX.  She continues to feel well and remains asymptomatic.  She is tolerating her treatments without significant side effects.  She has no neurologic complaints.  She denies any recent fevers or illnesses.  She has a good appetite and denies weight loss.  She has no chest pain, shortness of breath, cough, or hemoptysis.  She denies any nausea, vomiting, constipation, or diarrhea.  She has no melena or hematochezia.  She has no urinary complaints.  Patient offers no specific complaints today.  REVIEW OF SYSTEMS:   Review of Systems  Constitutional: Negative.  Negative for fever, malaise/fatigue and weight loss.  Respiratory: Negative.  Negative for cough, hemoptysis and shortness of breath.   Cardiovascular: Negative.  Negative for chest pain and leg swelling.  Gastrointestinal: Negative.  Negative for abdominal pain, blood in stool and melena.  Genitourinary: Negative.  Negative for dysuria.  Musculoskeletal: Negative.  Negative for back pain.  Skin: Negative.  Negative for rash.  Neurological: Negative.  Negative for dizziness, focal weakness, weakness and headaches.  Psychiatric/Behavioral: Negative.  The patient is not nervous/anxious.     As per HPI. Otherwise, a complete review of systems is negative.  PAST MEDICAL HISTORY: Past Medical History:  Diagnosis Date   Adenocarcinoma of colon (HCC) 04/03/2023   a.) 4 x 3 cm cecal mass  identified on colonoscopy during workup for IDA. Bx 04/02/2020 --> moderately differentiated adenocarcinoma; MMR stable.   Aortic atherosclerosis (HCC)    Arteriosclerotic cardiovascular disease    Carpal tunnel syndrome    Current smoker    Hiatal hernia    Hypercholesteremia    IDA (iron deficiency anemia)     PAST SURGICAL HISTORY: Past Surgical History:  Procedure Laterality Date   COLONOSCOPY     ESOPHAGOGASTRODUODENOSCOPY     PORTACATH PLACEMENT N/A 05/29/2023   Procedure: INSERTION PORT-A-CATH;  Surgeon: Carolan Shiver, MD;  Location: [facility] ORS;  Service: General;  Laterality: N/A;    FAMILY HISTORY: Family History  Problem Relation Age of Onset   Breast cancer Mother 5   Heart disease Father    Breast cancer Maternal Aunt 36    ADVANCED DIRECTIVES (Y/N):  N  HEALTH MAINTENANCE: Social History   Tobacco Use   Smoking status: Former    Current packs/day: 0.00    Average packs/day: 1 pack/day for 38.0 years (38.0 ttl pk-yrs)    Types: Cigarettes    Quit date: 04/03/2023    Years since quitting: 0.3   Smokeless tobacco: Never  Vaping Use   Vaping status: Never Used  Substance Use Topics   Alcohol use: No    Alcohol/week: 0.0 standard drinks of alcohol   Drug use: No     Colonoscopy:  PAP:  Bone density:  Lipid panel:  Allergies  Allergen Reactions   Sulfa Antibiotics Hives and Swelling    Current Outpatient Medications  Medication Sig Dispense Refill   atorvastatin (LIPITOR) 40 MG tablet Take 40 mg by mouth at bedtime.     dexamethasone (DECADRON) 4 MG  tablet Take 2 tablets (8 mg total) by mouth daily. Start the day after chemotherapy for 2 days. Take with food. 30 tablet 1   lidocaine-prilocaine (EMLA) cream Apply to affected area once 30 g 3   ferrous sulfate ER (SLOW FE) 142 (45 Fe) MG TBCR tablet Take 1 tablet by mouth 2 (two) times daily. (Patient not taking: Reported on 07/27/2023)     Omega-3 Fatty Acids (FISH OIL) 1000 MG CAPS Take 1  capsule by mouth daily. (Patient not taking: Reported on 07/13/2023)     ondansetron (ZOFRAN) 4 MG tablet Take 1 tablet (4 mg total) by mouth daily as needed for nausea or vomiting. (Patient not taking: Reported on 06/08/2023) 10 tablet 1   ondansetron (ZOFRAN) 8 MG tablet Take 1 tablet (8 mg total) by mouth every 8 (eight) hours as needed for nausea or vomiting. Start on the third day after chemotherapy. (Patient not taking: Reported on 06/08/2023) 30 tablet 1   oxyCODONE-acetaminophen (PERCOCET) 5-325 MG tablet Take 1 tablet by mouth every 4 (four) hours as needed for severe pain. (Patient not taking: Reported on 05/18/2023) 20 tablet 0   prochlorperazine (COMPAZINE) 10 MG tablet Take 1 tablet (10 mg total) by mouth every 6 (six) hours as needed for nausea or vomiting. (Patient not taking: Reported on 06/08/2023) 30 tablet 1   No current facility-administered medications for this visit.   Facility-Administered Medications Ordered in Other Visits  Medication Dose Route Frequency Provider Last Rate Last Admin   dextrose 5 % solution   Intravenous Once Orlie Dakin, Tollie Pizza, MD       fluorouracil (ADRUCIL) 3,500 mg in sodium chloride 0.9 % 80 mL chemo infusion  2,400 mg/m2 (Treatment Plan Recorded) Intravenous 1 day or 1 dose Orlie Dakin, Tollie Pizza, MD       fluorouracil (ADRUCIL) chemo injection 600 mg  400 mg/m2 (Treatment Plan Recorded) Intravenous Once Jeralyn Ruths, MD       leucovorin 620 mg in dextrose 5 % 250 mL infusion  400 mg/m2 (Treatment Plan Recorded) Intravenous Once Jeralyn Ruths, MD       oxaliplatin (ELOXATIN) 130 mg in dextrose 5 % 500 mL chemo infusion  85 mg/m2 (Treatment Plan Recorded) Intravenous Once Jeralyn Ruths, MD        OBJECTIVE: Vitals:   07/27/23 0838  BP: 111/69  Pulse: 78  Resp: 16  Temp: (!) 96.3 F (35.7 C)  SpO2: 100%     Body mass index is 19.44 kg/m.    ECOG FS:0 - Asymptomatic  General: Well-developed, well-nourished, no acute  distress. Eyes: Pink conjunctiva, anicteric sclera. HEENT: Normocephalic, moist mucous membranes. Lungs: No audible wheezing or coughing. Heart: Regular rate and rhythm. Abdomen: Soft, nontender, no obvious distention. Musculoskeletal: No edema, cyanosis, or clubbing. Neuro: Alert, answering all questions appropriately. Cranial nerves grossly intact. Skin: No rashes or petechiae noted. Psych: Normal affect.  LAB RESULTS:  Lab Results  Component Value Date   NA 137 07/27/2023   K 3.5 07/27/2023   CL 104 07/27/2023   CO2 25 07/27/2023   GLUCOSE 115 (H) 07/27/2023   BUN 21 07/27/2023   CREATININE 0.63 07/27/2023   CALCIUM 9.1 07/27/2023   PROT 7.2 07/27/2023   ALBUMIN 4.0 07/27/2023   AST 44 (H) 07/27/2023   ALT 51 (H) 07/27/2023   ALKPHOS 86 07/27/2023   BILITOT 1.7 (H) 07/27/2023   GFRNONAA >60 07/27/2023    Lab Results  Component Value Date   WBC 4.6 07/27/2023  NEUTROABS 2.6 07/27/2023   HGB 11.2 (L) 07/27/2023   HCT 32.8 (L) 07/27/2023   MCV 92.9 07/27/2023   PLT 123 (L) 07/27/2023     STUDIES: No results found.  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  MRI of the liver on May 06, 2023 showed a 2.4 cm lesion in the liver which biopsy confirmed an isolated metastatic lesion.  Consultation at [facility] recommended adjuvant chemotherapy with FOLFOX for 75-months followed by possible metastectomy of liver lesion.  Patient already has follow-up scheduled at [facility] in January 2025.  CEA is within normal limits at 2.8.  Despite increasing bilirubin, will proceed with cycle 5 of treatment today.  Oxaliplatin does not need to be dose reduced with hyperbilirubinemia.  5-FU will only need to be dose reduced if bilirubin increases to greater than 5.0.   Return to clinic in 2 days for pump removal and then in 2 weeks for further evaluation and consideration of cycle 6.  Patient has an appointment at [facility] on August 24, 2023 for evaluation of possible partial hepatectomy.   Iron deficiency anemia: Chronic and unchanged.  Patient's hemoglobin is 11.2 today.  Her most recent iron stores on May 17, 2021 were reported as normal.  Patient last received IV Venofer on May 04, 2023. Hypokalemia: Resolved.   Transaminitis: Mild, monitor. Hyperbilirubinemia: Bilirubin has trended up slightly to 1.7.  Proceed with treatment as above.  Will get a CT scan of her abdomen pelvis for further evaluation.   Patient expressed understanding and was in agreement with this plan. She also understands that She can call clinic at any time with any questions, concerns, or complaints.    Cancer Staging  Colon adenocarcinoma [facility]) Staging form: Colon and Rectum, AJCC 8th Edition - Pathologic: pT3, pN2b, pM1 - Signed by Michaelyn Barter, MD on 05/18/2023 Stage prefix: Initial diagnosis Total positive nodes: 7 Total nodes examined: 60 Histologic grading system: 4 grade system Histologic grade (G): G2   Jeralyn Ruths, MD   07/27/2023 10:19 AM

## 2023-07-27 NOTE — Progress Notes (Signed)
OK to treat w/ Tbili = 1.7 today per Dr. Orlie Dakin.  Ebony Hail, Pharm.D., CPP 07/27/2023@9 :53 AM

## 2023-07-27 NOTE — Patient Instructions (Signed)
 [facility] - A DEPT OF [facility]  Discharge Instructions: Thank you for choosing [facility] to provide your oncology and hematology care.  If you have a lab appointment with the Cancer Center, please go directly to the Cancer Center and check in at the registration area.  Wear comfortable clothing and clothing appropriate for easy access to any Portacath or PICC line.   We strive to give you quality time with your provider. You may need to reschedule your appointment if you arrive late (15 or more minutes).  Arriving late affects you and other patients whose appointments are after yours.  Also, if you miss three or more appointments without notifying the office, you may be dismissed from the clinic at the provider's discretion.      For prescription refill requests, have your pharmacy contact our office and allow 72 hours for refills to be completed.    Today you received the following chemotherapy and/or immunotherapy agents Oxaliplatin, Leucovorin and Adrucil       To help prevent nausea and vomiting after your treatment, we encourage you to take your nausea medication as directed.  BELOW ARE SYMPTOMS THAT SHOULD BE REPORTED IMMEDIATELY: *FEVER GREATER THAN 100.4 F (38 C) OR HIGHER *CHILLS OR SWEATING *NAUSEA AND VOMITING THAT IS NOT CONTROLLED WITH YOUR NAUSEA MEDICATION *UNUSUAL SHORTNESS OF BREATH *UNUSUAL BRUISING OR BLEEDING *URINARY PROBLEMS (pain or burning when urinating, or frequent urination) *BOWEL PROBLEMS (unusual diarrhea, constipation, pain near the anus) TENDERNESS IN MOUTH AND THROAT WITH OR WITHOUT PRESENCE OF ULCERS (sore throat, sores in mouth, or a toothache) UNUSUAL RASH, SWELLING OR PAIN  UNUSUAL VAGINAL DISCHARGE OR ITCHING   Items with * indicate a potential emergency and should be followed up as soon as possible or go to the Emergency Department if any problems should occur.  Please show the CHEMOTHERAPY  ALERT CARD or IMMUNOTHERAPY ALERT CARD at check-in to the Emergency Department and triage nurse.  Should you have questions after your visit or need to cancel or reschedule your appointment, please contact [facility] - A DEPT OF [facility]  [phone] and follow the prompts.  Office hours are 8:00 a.m. to 4:30 p.m. Monday - Friday. Please note that voicemails left after 4:00 p.m. may not be returned until the following business day.  We are closed weekends and major holidays. You have access to a nurse at all times for urgent questions. Please call the main number to the clinic [phone] and follow the prompts.  For any non-urgent questions, you may also contact your provider using MyChart. We now offer e-Visits for anyone 17 and older to request care online for non-urgent symptoms. For details visit [email or web address].   Also download the MyChart app! Go to the app store, search "MyChart", open the app, select [place], and log in with your MyChart username and password.

## 2023-07-28 ENCOUNTER — Other Ambulatory Visit: Payer: Self-pay

## 2023-07-29 ENCOUNTER — Inpatient Hospital Stay: Payer: No Typology Code available for payment source

## 2023-07-29 ENCOUNTER — Other Ambulatory Visit: Payer: Self-pay

## 2023-07-29 VITALS — BP 131/75 | HR 82 | Temp 98.8°F | Resp 16

## 2023-07-29 DIAGNOSIS — Z5111 Encounter for antineoplastic chemotherapy: Secondary | ICD-10-CM | POA: Diagnosis not present

## 2023-07-29 DIAGNOSIS — C189 Malignant neoplasm of colon, unspecified: Secondary | ICD-10-CM

## 2023-07-29 MED ORDER — HEPARIN SOD (PORK) LOCK FLUSH 100 UNIT/ML IV SOLN
500.0000 [IU] | Freq: Once | INTRAVENOUS | Status: AC | PRN
  Administered 2023-07-29: 500 [IU]
  Filled 2023-07-29: qty 5

## 2023-07-29 MED ORDER — SODIUM CHLORIDE 0.9% FLUSH
10.0000 mL | INTRAVENOUS | Status: DC | PRN
  Administered 2023-07-29: 10 mL
  Filled 2023-07-29: qty 10

## 2023-07-30 ENCOUNTER — Ambulatory Visit
Admission: RE | Admit: 2023-07-30 | Discharge: 2023-07-30 | Disposition: A | Discharge status: Home / Self Care | Payer: No Typology Code available for payment source | Source: Ambulatory Visit | Attending: Oncology | Admitting: Oncology

## 2023-07-30 DIAGNOSIS — C189 Malignant neoplasm of colon, unspecified: Secondary | ICD-10-CM | POA: Insufficient documentation

## 2023-07-30 DIAGNOSIS — C787 Secondary malignant neoplasm of liver and intrahepatic bile duct: Secondary | ICD-10-CM | POA: Diagnosis present

## 2023-07-30 MED ORDER — IOHEXOL 300 MG/ML  SOLN
75.0000 mL | Freq: Once | INTRAMUSCULAR | Status: AC | PRN
  Administered 2023-07-30: 75 mL via INTRAVENOUS

## 2023-07-30 MED ORDER — BARIUM SULFATE 2 % PO SUSP
450.0000 mL | Freq: Once | ORAL | Status: AC
  Administered 2023-07-30: 900 mL via ORAL

## 2023-08-10 ENCOUNTER — Ambulatory Visit: Payer: No Typology Code available for payment source

## 2023-08-10 ENCOUNTER — Other Ambulatory Visit: Payer: No Typology Code available for payment source

## 2023-08-11 ENCOUNTER — Ambulatory Visit: Payer: No Typology Code available for payment source

## 2023-08-11 ENCOUNTER — Inpatient Hospital Stay: Payer: No Typology Code available for payment source

## 2023-08-11 ENCOUNTER — Other Ambulatory Visit: Payer: No Typology Code available for payment source

## 2023-08-11 VITALS — BP 139/62 | HR 79 | Temp 96.7°F | Resp 17 | Wt 117.5 lb

## 2023-08-11 DIAGNOSIS — C189 Malignant neoplasm of colon, unspecified: Secondary | ICD-10-CM

## 2023-08-11 DIAGNOSIS — Z5111 Encounter for antineoplastic chemotherapy: Secondary | ICD-10-CM | POA: Diagnosis not present

## 2023-08-11 LAB — CBC WITH DIFFERENTIAL (CANCER CENTER ONLY)
Abs Immature Granulocytes: 0 10*3/uL (ref 0.00–0.07)
Basophils Absolute: 0 10*3/uL (ref 0.0–0.1)
Basophils Relative: 1 %
Eosinophils Absolute: 0.1 10*3/uL (ref 0.0–0.5)
Eosinophils Relative: 1 %
HCT: 31.4 % — ABNORMAL LOW (ref 36.0–46.0)
Hemoglobin: 10.8 g/dL — ABNORMAL LOW (ref 12.0–15.0)
Immature Granulocytes: 0 %
Lymphocytes Relative: 25 %
Lymphs Abs: 1.2 10*3/uL (ref 0.7–4.0)
MCH: 32.4 pg (ref 26.0–34.0)
MCHC: 34.4 g/dL (ref 30.0–36.0)
MCV: 94.3 fL (ref 80.0–100.0)
Monocytes Absolute: 0.7 10*3/uL (ref 0.1–1.0)
Monocytes Relative: 15 %
Neutro Abs: 2.8 10*3/uL (ref 1.7–7.7)
Neutrophils Relative %: 58 %
Platelet Count: 125 10*3/uL — ABNORMAL LOW (ref 150–400)
RBC: 3.33 MIL/uL — ABNORMAL LOW (ref 3.87–5.11)
RDW: 16.3 % — ABNORMAL HIGH (ref 11.5–15.5)
WBC Count: 4.8 10*3/uL (ref 4.0–10.5)
nRBC: 0 % (ref 0.0–0.2)

## 2023-08-11 LAB — CMP (CANCER CENTER ONLY)
ALT: 35 U/L (ref 0–44)
AST: 32 U/L (ref 15–41)
Albumin: 3.6 g/dL (ref 3.5–5.0)
Alkaline Phosphatase: 89 U/L (ref 38–126)
Anion gap: 9 (ref 5–15)
BUN: 18 mg/dL (ref 8–23)
CO2: 26 mmol/L (ref 22–32)
Calcium: 8.9 mg/dL (ref 8.9–10.3)
Chloride: 101 mmol/L (ref 98–111)
Creatinine: 0.55 mg/dL (ref 0.44–1.00)
GFR, Estimated: >60 mL/min (ref >60)
Glucose, Bld: 135 mg/dL — ABNORMAL HIGH (ref 70–99)
Potassium: 3.4 mmol/L — ABNORMAL LOW (ref 3.5–5.1)
Sodium: 136 mmol/L (ref 135–145)
Total Bilirubin: 1.6 mg/dL — ABNORMAL HIGH (ref <1.2)
Total Protein: 6.8 g/dL (ref 6.5–8.1)

## 2023-08-11 MED ORDER — DEXAMETHASONE SODIUM PHOSPHATE 10 MG/ML IJ SOLN
10.0000 mg | Freq: Once | INTRAMUSCULAR | Status: AC
  Administered 2023-08-11: 10 mg via INTRAVENOUS
  Filled 2023-08-11: qty 1

## 2023-08-11 MED ORDER — PALONOSETRON HCL INJECTION 0.25 MG/5ML
0.2500 mg | Freq: Once | INTRAVENOUS | Status: AC
Start: 2023-08-11 — End: 2023-08-11
  Administered 2023-08-11: 0.25 mg via INTRAVENOUS
  Filled 2023-08-11: qty 5

## 2023-08-11 MED ORDER — DEXTROSE 5 % IV SOLN
Freq: Once | INTRAVENOUS | Status: AC
  Filled 2023-08-11: qty 250

## 2023-08-11 MED ORDER — LEUCOVORIN CALCIUM INJECTION 350 MG
400.0000 mg/m2 | Freq: Once | INTRAVENOUS | Status: AC
  Administered 2023-08-11: 620 mg via INTRAVENOUS
  Filled 2023-08-11: qty 31

## 2023-08-11 MED ORDER — SODIUM CHLORIDE 0.9 % IV SOLN
2400.0000 mg/m2 | INTRAVENOUS | Status: AC
  Administered 2023-08-11: 3500 mg via INTRAVENOUS
  Filled 2023-08-11: qty 70

## 2023-08-11 MED ORDER — OXALIPLATIN CHEMO INJECTION 100 MG/20ML
85.0000 mg/m2 | Freq: Once | INTRAVENOUS | Status: AC
  Administered 2023-08-11: 130 mg via INTRAVENOUS
  Filled 2023-08-11: qty 26

## 2023-08-11 MED ORDER — FLUOROURACIL CHEMO INJECTION 2.5 GM/50ML
400.0000 mg/m2 | Freq: Once | INTRAVENOUS | Status: AC
Start: 2023-08-11 — End: 2023-08-11
  Administered 2023-08-11: 600 mg via INTRAVENOUS
  Filled 2023-08-11: qty 12

## 2023-08-11 NOTE — Addendum Note (Signed)
Addended by: Mickel Crow H on: 08/11/2023 12:03 PM   Modules accepted: Orders

## 2023-08-11 NOTE — Patient Instructions (Signed)
[facility] - A DEPT OF [facility]  Discharge Instructions: Thank you for choosing [facility] to provide your oncology and hematology care.  If you have a lab appointment with the Cancer Center, please go directly to the Cancer Center and check in at the registration area.  Wear comfortable clothing and clothing appropriate for easy access to any Portacath or PICC line.   We strive to give you quality time with your provider. You may need to reschedule your appointment if you arrive late (15 or more minutes).  Arriving late affects you and other patients whose appointments are after yours.  Also, if you miss three or more appointments without notifying the office, you may be dismissed from the clinic at the provider's discretion.      For prescription refill requests, have your pharmacy contact our office and allow 72 hours for refills to be completed.    Today you received the following chemotherapy and/or immunotherapy agents Oxaliplatin, leucovorin and Adrucil       To help prevent nausea and vomiting after your treatment, we encourage you to take your nausea medication as directed.  BELOW ARE SYMPTOMS THAT SHOULD BE REPORTED IMMEDIATELY: *FEVER GREATER THAN 100.4 F (38 C) OR HIGHER *CHILLS OR SWEATING *NAUSEA AND VOMITING THAT IS NOT CONTROLLED WITH YOUR NAUSEA MEDICATION *UNUSUAL SHORTNESS OF BREATH *UNUSUAL BRUISING OR BLEEDING *URINARY PROBLEMS (pain or burning when urinating, or frequent urination) *BOWEL PROBLEMS (unusual diarrhea, constipation, pain near the anus) TENDERNESS IN MOUTH AND THROAT WITH OR WITHOUT PRESENCE OF ULCERS (sore throat, sores in mouth, or a toothache) UNUSUAL RASH, SWELLING OR PAIN  UNUSUAL VAGINAL DISCHARGE OR ITCHING   Items with * indicate a potential emergency and should be followed up as soon as possible or go to the Emergency Department if any problems should occur.  Please show the CHEMOTHERAPY  ALERT CARD or IMMUNOTHERAPY ALERT CARD at check-in to the Emergency Department and triage nurse.  Should you have questions after your visit or need to cancel or reschedule your appointment, please contact [facility] - A DEPT OF [facility]  [phone] and follow the prompts.  Office hours are 8:00 a.m. to 4:30 p.m. Monday - Friday. Please note that voicemails left after 4:00 p.m. may not be returned until the following business day.  We are closed weekends and major holidays. You have access to a nurse at all times for urgent questions. Please call the main number to the clinic [phone] and follow the prompts.  For any non-urgent questions, you may also contact your provider using MyChart. We now offer e-Visits for anyone 2 and older to request care online for non-urgent symptoms. For details visit [email or web address].   Also download the MyChart app! Go to the app store, search "MyChart", open the app, select [place], and log in with your MyChart username and password.

## 2023-08-13 ENCOUNTER — Inpatient Hospital Stay: Payer: No Typology Code available for payment source

## 2023-08-13 VITALS — BP 140/72 | HR 80 | Temp 97.6°F | Resp 18

## 2023-08-13 DIAGNOSIS — C189 Malignant neoplasm of colon, unspecified: Secondary | ICD-10-CM

## 2023-08-13 DIAGNOSIS — Z5111 Encounter for antineoplastic chemotherapy: Secondary | ICD-10-CM | POA: Diagnosis not present

## 2023-08-13 MED ORDER — HEPARIN SOD (PORK) LOCK FLUSH 100 UNIT/ML IV SOLN
500.0000 [IU] | Freq: Once | INTRAVENOUS | Status: AC | PRN
  Administered 2023-08-13: 500 [IU]
  Filled 2023-08-13: qty 5

## 2023-08-13 MED ORDER — SODIUM CHLORIDE 0.9% FLUSH
10.0000 mL | INTRAVENOUS | Status: DC | PRN
Start: 2023-08-13 — End: 2023-08-13
  Administered 2023-08-13: 10 mL
  Filled 2023-08-13: qty 10

## 2023-08-25 ENCOUNTER — Inpatient Hospital Stay: Payer: No Typology Code available for payment source

## 2023-08-25 ENCOUNTER — Encounter: Payer: Self-pay | Admitting: Oncology

## 2023-08-25 ENCOUNTER — Inpatient Hospital Stay (HOSPITAL_BASED_OUTPATIENT_CLINIC_OR_DEPARTMENT_OTHER): Payer: No Typology Code available for payment source | Admitting: Oncology

## 2023-08-25 ENCOUNTER — Inpatient Hospital Stay: Payer: No Typology Code available for payment source | Attending: Internal Medicine

## 2023-08-25 VITALS — BP 148/66 | HR 79 | Temp 96.4°F | Resp 16 | Ht 65.05 in | Wt 114.0 lb

## 2023-08-25 DIAGNOSIS — Z87891 Personal history of nicotine dependence: Secondary | ICD-10-CM | POA: Insufficient documentation

## 2023-08-25 DIAGNOSIS — C787 Secondary malignant neoplasm of liver and intrahepatic bile duct: Secondary | ICD-10-CM | POA: Insufficient documentation

## 2023-08-25 DIAGNOSIS — D509 Iron deficiency anemia, unspecified: Secondary | ICD-10-CM | POA: Insufficient documentation

## 2023-08-25 DIAGNOSIS — Z5111 Encounter for antineoplastic chemotherapy: Secondary | ICD-10-CM | POA: Insufficient documentation

## 2023-08-25 DIAGNOSIS — C189 Malignant neoplasm of colon, unspecified: Secondary | ICD-10-CM

## 2023-08-25 DIAGNOSIS — R7401 Elevation of levels of liver transaminase levels: Secondary | ICD-10-CM | POA: Insufficient documentation

## 2023-08-25 DIAGNOSIS — D696 Thrombocytopenia, unspecified: Secondary | ICD-10-CM | POA: Insufficient documentation

## 2023-08-25 DIAGNOSIS — Z803 Family history of malignant neoplasm of breast: Secondary | ICD-10-CM | POA: Diagnosis not present

## 2023-08-25 LAB — CBC WITH DIFFERENTIAL (CANCER CENTER ONLY)
Abs Immature Granulocytes: 0.01 10*3/uL (ref 0.00–0.07)
Basophils Absolute: 0 10*3/uL (ref 0.0–0.1)
Basophils Relative: 1 %
Eosinophils Absolute: 0 10*3/uL (ref 0.0–0.5)
Eosinophils Relative: 1 %
HCT: 31.9 % — ABNORMAL LOW (ref 36.0–46.0)
Hemoglobin: 10.9 g/dL — ABNORMAL LOW (ref 12.0–15.0)
Immature Granulocytes: 0 %
Lymphocytes Relative: 28 %
Lymphs Abs: 1.2 10*3/uL (ref 0.7–4.0)
MCH: 32.7 pg (ref 26.0–34.0)
MCHC: 34.2 g/dL (ref 30.0–36.0)
MCV: 95.8 fL (ref 80.0–100.0)
Monocytes Absolute: 0.6 10*3/uL (ref 0.1–1.0)
Monocytes Relative: 13 %
Neutro Abs: 2.5 10*3/uL (ref 1.7–7.7)
Neutrophils Relative %: 57 %
Platelet Count: 123 10*3/uL — ABNORMAL LOW (ref 150–400)
RBC: 3.33 MIL/uL — ABNORMAL LOW (ref 3.87–5.11)
RDW: 16.9 % — ABNORMAL HIGH (ref 11.5–15.5)
WBC Count: 4.4 10*3/uL (ref 4.0–10.5)
nRBC: 0 % (ref 0.0–0.2)

## 2023-08-25 LAB — CMP (CANCER CENTER ONLY)
ALT: 41 U/L (ref 0–44)
AST: 43 U/L — ABNORMAL HIGH (ref 15–41)
Albumin: 4 g/dL (ref 3.5–5.0)
Alkaline Phosphatase: 101 U/L (ref 38–126)
Anion gap: 10 (ref 5–15)
BUN: 22 mg/dL (ref 8–23)
CO2: 23 mmol/L (ref 22–32)
Calcium: 9 mg/dL (ref 8.9–10.3)
Chloride: 107 mmol/L (ref 98–111)
Creatinine: 0.57 mg/dL (ref 0.44–1.00)
GFR, Estimated: >60 mL/min (ref >60)
Glucose, Bld: 139 mg/dL — ABNORMAL HIGH (ref 70–99)
Potassium: 3.5 mmol/L (ref 3.5–5.1)
Sodium: 140 mmol/L (ref 135–145)
Total Bilirubin: 1.6 mg/dL — ABNORMAL HIGH (ref 0.0–1.2)
Total Protein: 7.1 g/dL (ref 6.5–8.1)

## 2023-08-25 MED ORDER — DEXAMETHASONE SODIUM PHOSPHATE 10 MG/ML IJ SOLN
10.0000 mg | Freq: Once | INTRAMUSCULAR | Status: AC
Start: 2023-08-25 — End: 2023-08-25
  Administered 2023-08-25: 10 mg via INTRAVENOUS
  Filled 2023-08-25: qty 1

## 2023-08-25 MED ORDER — LEUCOVORIN CALCIUM INJECTION 350 MG
400.0000 mg/m2 | Freq: Once | INTRAVENOUS | Status: AC
  Administered 2023-08-25: 620 mg via INTRAVENOUS
  Filled 2023-08-25: qty 31

## 2023-08-25 MED ORDER — SODIUM CHLORIDE 0.9 % IV SOLN
2400.0000 mg/m2 | INTRAVENOUS | Status: DC
  Administered 2023-08-25: 3500 mg via INTRAVENOUS
  Filled 2023-08-25: qty 70

## 2023-08-25 MED ORDER — FLUOROURACIL CHEMO INJECTION 2.5 GM/50ML
400.0000 mg/m2 | Freq: Once | INTRAVENOUS | Status: AC
Start: 2023-08-25 — End: 2023-08-25
  Administered 2023-08-25: 600 mg via INTRAVENOUS
  Filled 2023-08-25: qty 12

## 2023-08-25 MED ORDER — OXALIPLATIN CHEMO INJECTION 100 MG/20ML
85.0000 mg/m2 | Freq: Once | INTRAVENOUS | Status: AC
  Administered 2023-08-25: 130 mg via INTRAVENOUS
  Filled 2023-08-25: qty 26

## 2023-08-25 MED ORDER — PALONOSETRON HCL INJECTION 0.25 MG/5ML
0.2500 mg | Freq: Once | INTRAVENOUS | Status: AC
  Administered 2023-08-25: 0.25 mg via INTRAVENOUS
  Filled 2023-08-25: qty 5

## 2023-08-25 MED ORDER — DEXTROSE 5 % IV SOLN
Freq: Once | INTRAVENOUS | Status: AC
  Filled 2023-08-25: qty 250

## 2023-08-25 NOTE — Patient Instructions (Signed)
 [facility] - A DEPT OF [facility]  Discharge Instructions: Thank you for choosing [facility] to provide your oncology and hematology care.  If you have a lab appointment with the Cancer Center, please go directly to the Cancer Center and check in at the registration area.  Wear comfortable clothing and clothing appropriate for easy access to any Portacath or PICC line.   We strive to give you quality time with your provider. You may need to reschedule your appointment if you arrive late (15 or more minutes).  Arriving late affects you and other patients whose appointments are after yours.  Also, if you miss three or more appointments without notifying the office, you may be dismissed from the clinic at the provider's discretion.      For prescription refill requests, have your pharmacy contact our office and allow 72 hours for refills to be completed.    Today you received the following chemotherapy and/or immunotherapy agents- oxaliplatin, leucovorin, 5FU      To help prevent nausea and vomiting after your treatment, we encourage you to take your nausea medication as directed.  BELOW ARE SYMPTOMS THAT SHOULD BE REPORTED IMMEDIATELY: *FEVER GREATER THAN 100.4 F (38 C) OR HIGHER *CHILLS OR SWEATING *NAUSEA AND VOMITING THAT IS NOT CONTROLLED WITH YOUR NAUSEA MEDICATION *UNUSUAL SHORTNESS OF BREATH *UNUSUAL BRUISING OR BLEEDING *URINARY PROBLEMS (pain or burning when urinating, or frequent urination) *BOWEL PROBLEMS (unusual diarrhea, constipation, pain near the anus) TENDERNESS IN MOUTH AND THROAT WITH OR WITHOUT PRESENCE OF ULCERS (sore throat, sores in mouth, or a toothache) UNUSUAL RASH, SWELLING OR PAIN  UNUSUAL VAGINAL DISCHARGE OR ITCHING   Items with * indicate a potential emergency and should be followed up as soon as possible or go to the Emergency Department if any problems should occur.  Please show the CHEMOTHERAPY ALERT CARD  or IMMUNOTHERAPY ALERT CARD at check-in to the Emergency Department and triage nurse.  Should you have questions after your visit or need to cancel or reschedule your appointment, please contact [facility] - A DEPT OF [facility]  [phone] and follow the prompts.  Office hours are 8:00 a.m. to 4:30 p.m. Monday - Friday. Please note that voicemails left after 4:00 p.m. may not be returned until the following business day.  We are closed weekends and major holidays. You have access to a nurse at all times for urgent questions. Please call the main number to the clinic [phone] and follow the prompts.  For any non-urgent questions, you may also contact your provider using MyChart. We now offer e-Visits for anyone 66 and older to request care online for non-urgent symptoms. For details visit [email or web address].   Also download the MyChart app! Go to the app store, search "MyChart", open the app, select , and log in with your MyChart username and password.

## 2023-08-25 NOTE — Progress Notes (Signed)
 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  INTERVAL HISTORY: Patient returns to clinic today for repeat laboratory work, further evaluation, consideration of cycle 7 of FOLFOX.  She continues to feel well and remains asymptomatic.  She is tolerating her treatments without significant side effects.  She has no neurologic complaints.  She denies any recent fevers or illnesses.  She has a good appetite and denies weight loss.  She has no chest pain, shortness of breath, cough, or hemoptysis.  She denies any nausea, vomiting, constipation, or diarrhea.  She has no melena or hematochezia.  She has no urinary complaints.  Patient offers no specific complaints today.  REVIEW OF SYSTEMS:   Review of Systems  Constitutional: Negative.  Negative for fever, malaise/fatigue and weight loss.  Respiratory: Negative.  Negative for cough, hemoptysis and shortness of breath.   Cardiovascular: Negative.  Negative for chest pain and leg swelling.  Gastrointestinal: Negative.  Negative for abdominal pain, blood in stool and melena.  Genitourinary: Negative.  Negative for dysuria.  Musculoskeletal: Negative.  Negative for back pain.  Skin: Negative.  Negative for rash.  Neurological: Negative.  Negative for dizziness, focal weakness, weakness and headaches.  Psychiatric/Behavioral: Negative.  The patient is not nervous/anxious.     As per HPI. Otherwise, a complete review of systems is negative.  PAST MEDICAL HISTORY: Past Medical History:  Diagnosis Date   Adenocarcinoma of colon (HCC) 04/03/2023   a.) 4 x 3 cm cecal mass identified  on colonoscopy during workup for IDA. Bx 04/02/2020 --> moderately differentiated adenocarcinoma; MMR stable.   Aortic atherosclerosis (HCC)    Arteriosclerotic cardiovascular disease    Carpal tunnel syndrome    Current smoker    Hiatal hernia    Hypercholesteremia    IDA (iron  deficiency anemia)     PAST SURGICAL HISTORY: Past Surgical History:  Procedure Laterality Date   COLONOSCOPY     ESOPHAGOGASTRODUODENOSCOPY     PORTACATH PLACEMENT N/A 05/29/2023   Procedure: INSERTION PORT-A-CATH;  Surgeon: Rodolph Romano, MD;  Location: [facility] ORS;  Service: General;  Laterality: N/A;    FAMILY HISTORY: Family History  Problem Relation Age of Onset   Breast cancer Mother 67   Heart disease Father    Breast cancer Maternal Aunt 35    ADVANCED DIRECTIVES (Y/N):  N  HEALTH MAINTENANCE: Social History   Tobacco Use   Smoking status: Former    Current packs/day: 0.00    Average packs/day: 1 pack/day for 38.0 years (38.0 ttl pk-yrs)    Types: Cigarettes    Quit date: 04/03/2023    Years since quitting: 0.3   Smokeless tobacco: Never  Vaping Use   Vaping status: Never Used  Substance Use Topics   Alcohol use: No    Alcohol/week: 0.0 standard drinks of alcohol   Drug use: No     Colonoscopy:  PAP:  Bone density:  Lipid panel:  Allergies  Allergen Reactions   Sulfa Antibiotics Hives and Swelling    Current Outpatient Medications  Medication Sig Dispense Refill   atorvastatin (LIPITOR) 40 MG tablet Take 40 mg by mouth at bedtime.     dexamethasone  (DECADRON ) 4 MG tablet Take  2 tablets (8 mg total) by mouth daily. Start the day after chemotherapy for 2 days. Take with food. 30 tablet 1   ferrous sulfate ER (SLOW FE) 142 (45 Fe) MG TBCR tablet Take 1 tablet by mouth 2 (two) times daily. (Patient not taking: Reported on 08/25/2023)     lidocaine -prilocaine  (EMLA ) cream Apply to affected area once (Patient not taking: Reported on 08/25/2023) 30 g 3   Omega-3 Fatty Acids  (FISH OIL) 1000 MG CAPS Take 1 capsule by mouth daily. (Patient not taking: Reported on 08/25/2023)     ondansetron  (ZOFRAN ) 4 MG tablet Take 1 tablet (4 mg total) by mouth daily as needed for nausea or vomiting. (Patient not taking: Reported on 06/08/2023) 10 tablet 1   ondansetron  (ZOFRAN ) 8 MG tablet Take 1 tablet (8 mg total) by mouth every 8 (eight) hours as needed for nausea or vomiting. Start on the third day after chemotherapy. (Patient not taking: Reported on 08/25/2023) 30 tablet 1   oxyCODONE -acetaminophen  (PERCOCET) 5-325 MG tablet Take 1 tablet by mouth every 4 (four) hours as needed for severe pain. (Patient not taking: Reported on 05/18/2023) 20 tablet 0   prochlorperazine  (COMPAZINE ) 10 MG tablet Take 1 tablet (10 mg total) by mouth every 6 (six) hours as needed for nausea or vomiting. (Patient not taking: Reported on 08/25/2023) 30 tablet 1   No current facility-administered medications for this visit.    OBJECTIVE: Vitals:   08/25/23 0844  BP: (!) 148/66  Pulse: 79  Resp: 16  Temp: (!) 96.4 F (35.8 C)  SpO2: 100%     Body mass index is 18.94 kg/m.    ECOG FS:0 - Asymptomatic  General: Well-developed, well-nourished, no acute distress. Eyes: Pink conjunctiva, anicteric sclera. HEENT: Normocephalic, moist mucous membranes. Lungs: No audible wheezing or coughing. Heart: Regular rate and rhythm. Abdomen: Soft, nontender, no obvious distention. Musculoskeletal: No edema, cyanosis, or clubbing. Neuro: Alert, answering all questions appropriately. Cranial nerves grossly intact. Skin: No rashes or petechiae noted. Psych: Normal affect.  LAB RESULTS:  Lab Results  Component Value Date   NA 140 08/25/2023   K 3.5 08/25/2023   CL 107 08/25/2023   CO2 23 08/25/2023   GLUCOSE 139 (H) 08/25/2023   BUN 22 08/25/2023   CREATININE 0.57 08/25/2023   CALCIUM  9.0 08/25/2023   PROT 7.1 08/25/2023   ALBUMIN  4.0 08/25/2023   AST 43 (H) 08/25/2023   ALT 41 08/25/2023   ALKPHOS 101  08/25/2023   BILITOT 1.6 (H) 08/25/2023   GFRNONAA >60 08/25/2023    Lab Results  Component Value Date   WBC 4.4 08/25/2023   NEUTROABS 2.5 08/25/2023   HGB 10.9 (L) 08/25/2023   HCT 31.9 (L) 08/25/2023   MCV 95.8 08/25/2023   PLT 123 (L) 08/25/2023     STUDIES: CT ABDOMEN PELVIS W CONTRAST Result Date: 08/09/2023 CLINICAL DATA:  Metastatic colon cancer, elevated bilirubin EXAM: CT ABDOMEN AND PELVIS WITH CONTRAST TECHNIQUE: Multidetector CT imaging of the abdomen and pelvis was performed using the standard protocol following bolus administration of intravenous contrast. RADIATION DOSE REDUCTION: This exam was performed according to the departmental dose-optimization program which includes automated exposure control, adjustment of the mA and/or kV according to patient size and/or use of iterative reconstruction technique. CONTRAST:  75mL OMNIPAQUE  IOHEXOL  300 MG/ML  SOLN COMPARISON:  MRI abdomen dated 05/06/2023. CT abdomen/pelvis dated 04/17/2023. FINDINGS: Lower chest: Lung bases are clear. Hepatobiliary: 14 mm lesion in segment 6 (series 2/image 30), previously 2.4 cm, suggesting  improving hepatic metastasis. Gallbladder is decompressed. No intrahepatic or extrahepatic duct dilatation. Pancreas: Within normal limits. Spleen: Within normal limits. Adrenals/Urinary Tract: Adrenal glands are within normal limits. Kidneys are within normal limits.  No hydronephrosis. Bladder is within normal limits. Stomach/Bowel: Stomach is within normal limits. No evidence of bowel obstruction. Status post right hemicolectomy with appendectomy. Vascular/Lymphatic: No evidence of abdominal aortic aneurysm. Atherosclerotic calcifications of the abdominal aorta and branch vessels, although vessels remain patent. No suspicious abdominopelvic lymphadenopathy. Prior ileocolic lymphadenopathy is no longer visualized. Reproductive: Uterus is within normal limits. Bilateral ovaries with normal limits. Other: No  abdominopelvic ascites. Musculoskeletal: Visualized osseous structures are within normal limits. IMPRESSION: Status post right hemicolectomy. Improving hepatic metastasis, as above. No intrahepatic or extrahepatic dilatation. Prior ileocolic lymphadenopathy is no longer visualized. Electronically Signed   By: Pinkie Pebbles M.D.   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  MRI of the liver on May 06, 2023 showed a 2.4 cm lesion in the liver which biopsy confirmed an isolated metastatic lesion.  Consultation at [facility] recommended adjuvant chemotherapy with FOLFOX for 74-months followed by metastectomy of liver lesion.  CEA is within normal limits at 2.8.  CT scan on August 09, 2023 as well as imaging from [facility] on August 23, 2023 revealed decreased size of known liver lesion with no other evidence of disease.  Patient now has her surgery scheduled for September 29, 2023.  Patient's bilirubin remains mildly increased, but we will proceed with cycle 7 of treatment today.  Oxaliplatin  does not need to be dose reduced with hyperbilirubinemia.  5-FU will only needs to be dose reduced if bilirubin increases to greater than 5.0.  Return to clinic in 2 days for pump removal.  Patient would then return to clinic on October 13, 2023 2 weeks after her surgery for further evaluation and discussion of continuation of chemotherapy to complete a total of 12 cycles.   Iron  deficiency anemia: Chronic and unchanged.  Patient's hemoglobin is 10.9 today.  Her most recent iron  stores on May 18, 2023 were reported as normal.  Patient last received IV Venofer  on May 04, 2023. Thrombocytopenia: Mild, monitor.  Proceed with treatment as above. Hypokalemia: Resolved.    Transaminitis: AST remains mildly elevated at 43. Hyperbilirubinemia: Chronic and unchanged.  Patient's total bilirubin is stable at 1.6.  Proceed with treatment as above.   Patient expressed understanding and was in agreement with this plan. She also understands that She can call clinic at any time with any questions, concerns, or complaints.    Cancer Staging  Colon adenocarcinoma [facility]) Staging form: Colon and Rectum, AJCC 8th Edition - Pathologic: pT3, pN2b, pM1 - Signed by Agrawal, Kavita, MD on 05/18/2023 Stage prefix: Initial diagnosis Total positive nodes: 7 Total nodes examined: 60 Histologic grading system: 4 grade system Histologic grade (G): G2   Evalene JINNY Reusing, MD   08/25/2023 9:30 AM

## 2023-08-25 NOTE — Patient Instructions (Signed)

## 2023-08-26 ENCOUNTER — Telehealth: Payer: Self-pay | Admitting: *Deleted

## 2023-08-26 ENCOUNTER — Other Ambulatory Visit: Payer: Self-pay

## 2023-08-26 NOTE — Telephone Encounter (Signed)
 Call From [facility] Bruno Panther, NP to report that patient is going to have liver resection 09/29/23 and they want the last chemotherapy to be the one she had yesterday until after her surgery. I advised her that she is not scheduled for any more chemotherapy at this time and that she has a follow up appointment 2/25 with doctor.

## 2023-08-27 ENCOUNTER — Inpatient Hospital Stay: Payer: No Typology Code available for payment source

## 2023-08-27 VITALS — BP 136/75 | HR 86 | Resp 18

## 2023-08-27 DIAGNOSIS — Z5111 Encounter for antineoplastic chemotherapy: Secondary | ICD-10-CM | POA: Diagnosis not present

## 2023-08-27 DIAGNOSIS — C189 Malignant neoplasm of colon, unspecified: Secondary | ICD-10-CM

## 2023-08-27 MED ORDER — SODIUM CHLORIDE 0.9% FLUSH
10.0000 mL | INTRAVENOUS | Status: DC | PRN
Start: 2023-08-27 — End: 2023-08-27
  Administered 2023-08-27: 10 mL
  Filled 2023-08-27: qty 10

## 2023-08-27 MED ORDER — HEPARIN SOD (PORK) LOCK FLUSH 100 UNIT/ML IV SOLN
500.0000 [IU] | Freq: Once | INTRAVENOUS | Status: AC | PRN
Start: 2023-08-27 — End: 2023-08-27
  Administered 2023-08-27: 500 [IU]
  Filled 2023-08-27: qty 5

## 2023-09-07 ENCOUNTER — Ambulatory Visit: Payer: No Typology Code available for payment source

## 2023-09-07 ENCOUNTER — Other Ambulatory Visit: Payer: No Typology Code available for payment source

## 2023-09-07 ENCOUNTER — Ambulatory Visit: Payer: No Typology Code available for payment source | Admitting: Oncology

## 2023-09-28 ENCOUNTER — Other Ambulatory Visit: Payer: Self-pay

## 2023-10-12 ENCOUNTER — Other Ambulatory Visit: Payer: Self-pay | Admitting: *Deleted

## 2023-10-12 DIAGNOSIS — C189 Malignant neoplasm of colon, unspecified: Secondary | ICD-10-CM

## 2023-10-13 ENCOUNTER — Encounter: Payer: Self-pay | Admitting: Oncology

## 2023-10-13 ENCOUNTER — Inpatient Hospital Stay: Payer: No Typology Code available for payment source | Attending: Internal Medicine

## 2023-10-13 ENCOUNTER — Inpatient Hospital Stay: Payer: No Typology Code available for payment source | Admitting: Oncology

## 2023-10-14 ENCOUNTER — Other Ambulatory Visit: Payer: Self-pay | Admitting: Family Medicine

## 2023-10-14 DIAGNOSIS — Z1231 Encounter for screening mammogram for malignant neoplasm of breast: Secondary | ICD-10-CM

## 2023-10-15 ENCOUNTER — Other Ambulatory Visit: Payer: Self-pay

## 2023-10-19 ENCOUNTER — Encounter: Payer: Self-pay | Admitting: Oncology

## 2023-10-19 ENCOUNTER — Inpatient Hospital Stay: Payer: No Typology Code available for payment source | Attending: Internal Medicine

## 2023-10-19 ENCOUNTER — Inpatient Hospital Stay (HOSPITAL_BASED_OUTPATIENT_CLINIC_OR_DEPARTMENT_OTHER): Payer: No Typology Code available for payment source | Admitting: Oncology

## 2023-10-19 VITALS — BP 142/63 | HR 80 | Temp 96.6°F | Resp 16 | Ht 65.5 in | Wt 112.4 lb

## 2023-10-19 DIAGNOSIS — Z87891 Personal history of nicotine dependence: Secondary | ICD-10-CM | POA: Diagnosis not present

## 2023-10-19 DIAGNOSIS — Z5111 Encounter for antineoplastic chemotherapy: Secondary | ICD-10-CM | POA: Diagnosis present

## 2023-10-19 DIAGNOSIS — C189 Malignant neoplasm of colon, unspecified: Secondary | ICD-10-CM

## 2023-10-19 DIAGNOSIS — Z803 Family history of malignant neoplasm of breast: Secondary | ICD-10-CM | POA: Diagnosis not present

## 2023-10-19 DIAGNOSIS — D509 Iron deficiency anemia, unspecified: Secondary | ICD-10-CM | POA: Insufficient documentation

## 2023-10-19 DIAGNOSIS — Z95828 Presence of other vascular implants and grafts: Secondary | ICD-10-CM

## 2023-10-19 DIAGNOSIS — C787 Secondary malignant neoplasm of liver and intrahepatic bile duct: Secondary | ICD-10-CM | POA: Insufficient documentation

## 2023-10-19 LAB — CMP (CANCER CENTER ONLY)
ALT: 26 U/L (ref 0–44)
AST: 25 U/L (ref 15–41)
Albumin: 3.7 g/dL (ref 3.5–5.0)
Alkaline Phosphatase: 104 U/L (ref 38–126)
Anion gap: 8 (ref 5–15)
BUN: 16 mg/dL (ref 8–23)
CO2: 27 mmol/L (ref 22–32)
Calcium: 9 mg/dL (ref 8.9–10.3)
Chloride: 102 mmol/L (ref 98–111)
Creatinine: 0.54 mg/dL (ref 0.44–1.00)
GFR, Estimated: >60 mL/min (ref >60)
Glucose, Bld: 107 mg/dL — ABNORMAL HIGH (ref 70–99)
Potassium: 3.5 mmol/L (ref 3.5–5.1)
Sodium: 137 mmol/L (ref 135–145)
Total Bilirubin: 0.8 mg/dL (ref 0.0–1.2)
Total Protein: 7.2 g/dL (ref 6.5–8.1)

## 2023-10-19 LAB — CBC WITH DIFFERENTIAL/PLATELET
Abs Immature Granulocytes: 0.02 10*3/uL (ref 0.00–0.07)
Basophils Absolute: 0.1 10*3/uL (ref 0.0–0.1)
Basophils Relative: 1 %
Eosinophils Absolute: 0.2 10*3/uL (ref 0.0–0.5)
Eosinophils Relative: 3 %
HCT: 33.3 % — ABNORMAL LOW (ref 36.0–46.0)
Hemoglobin: 11.1 g/dL — ABNORMAL LOW (ref 12.0–15.0)
Immature Granulocytes: 0 %
Lymphocytes Relative: 21 %
Lymphs Abs: 1.2 10*3/uL (ref 0.7–4.0)
MCH: 33.3 pg (ref 26.0–34.0)
MCHC: 33.3 g/dL (ref 30.0–36.0)
MCV: 100 fL (ref 80.0–100.0)
Monocytes Absolute: 0.5 10*3/uL (ref 0.1–1.0)
Monocytes Relative: 9 %
Neutro Abs: 3.8 10*3/uL (ref 1.7–7.7)
Neutrophils Relative %: 66 %
Platelets: 287 10*3/uL (ref 150–400)
RBC: 3.33 MIL/uL — ABNORMAL LOW (ref 3.87–5.11)
RDW: 12.9 % (ref 11.5–15.5)
WBC: 5.9 10*3/uL (ref 4.0–10.5)
nRBC: 0 % (ref 0.0–0.2)

## 2023-10-19 MED ORDER — SODIUM CHLORIDE 0.9% FLUSH
10.0000 mL | Freq: Once | INTRAVENOUS | Status: AC
  Administered 2023-10-19: 10 mL via INTRAVENOUS
  Filled 2023-10-19: qty 10

## 2023-10-19 MED ORDER — HEPARIN SOD (PORK) LOCK FLUSH 100 UNIT/ML IV SOLN
500.0000 [IU] | Freq: Once | INTRAVENOUS | Status: AC
  Administered 2023-10-19: 500 [IU] via INTRAVENOUS
  Filled 2023-10-19: qty 5

## 2023-10-19 NOTE — Progress Notes (Signed)
 [facility]  Telephone:(336) [phone] Fax:(336) [phone]  ID: Shari Melendez OB: Jan 13, 1961  MR#: [id number]  UXL#:[id number]  Patient Care Team: Leim Fabry, MD as PCP - General (Family Medicine) Benita Gutter, RN as Oncology Nurse Navigator Orlie Dakin, Tollie Pizza, MD as Consulting Physician (Oncology)  CHIEF COMPLAINT: Stage IV adenocarcinoma of the colon with isolated liver metastasis.  INTERVAL HISTORY: Patient returns to clinic today postoperatively to discuss her pathology results and adjuvant treatment planning.  She underwent partial hepatectomy at [facility] on September 29, 2023 and tolerated procedure well.  By report she had a complete pathologic response in her liver.  She currently feels well and is asymptomatic.  She has no neurologic complaints.  She denies any recent fevers or illnesses.  She has a good appetite and denies weight loss.  She has no chest pain, shortness of breath, cough, or hemoptysis.  She denies any nausea, vomiting, constipation, or diarrhea.  She has no melena or hematochezia.  She has no urinary complaints.  Patient offers no specific complaints today.  REVIEW OF SYSTEMS:   Review of Systems  Constitutional: Negative.  Negative for fever, malaise/fatigue and weight loss.  Respiratory: Negative.  Negative for cough, hemoptysis and shortness of breath.   Cardiovascular: Negative.  Negative for chest pain and leg swelling.  Gastrointestinal: Negative.  Negative for abdominal pain, blood in stool and melena.  Genitourinary: Negative.  Negative for dysuria.  Musculoskeletal: Negative.  Negative for back pain.  Skin: Negative.  Negative for rash.  Neurological: Negative.  Negative for dizziness, focal weakness, weakness and headaches.  Psychiatric/Behavioral: Negative.  The patient is not nervous/anxious.     As per HPI. Otherwise, a complete review of systems is negative.  PAST MEDICAL HISTORY: Past Medical History:   Diagnosis Date   Adenocarcinoma of colon (HCC) 04/03/2023   a.) 4 x 3 cm cecal mass identified on colonoscopy during workup for IDA. Bx 04/02/2020 --> moderately differentiated adenocarcinoma; MMR stable.   Aortic atherosclerosis (HCC)    Arteriosclerotic cardiovascular disease    Carpal tunnel syndrome    Current smoker    Hiatal hernia    Hypercholesteremia    IDA (iron deficiency anemia)     PAST SURGICAL HISTORY: Past Surgical History:  Procedure Laterality Date   COLONOSCOPY     ESOPHAGOGASTRODUODENOSCOPY     PORTACATH PLACEMENT N/A 05/29/2023   Procedure: INSERTION PORT-A-CATH;  Surgeon: Carolan Shiver, MD;  Location: [facility] ORS;  Service: General;  Laterality: N/A;    FAMILY HISTORY: Family History  Problem Relation Age of Onset   Breast cancer Mother 53   Heart disease Father    Breast cancer Maternal Aunt 90    ADVANCED DIRECTIVES (Y/N):  N  HEALTH MAINTENANCE: Social History   Tobacco Use   Smoking status: Former    Current packs/day: 0.00    Average packs/day: 1 pack/day for 38.0 years (38.0 ttl pk-yrs)    Types: Cigarettes    Quit date: 04/03/2023    Years since quitting: 0.5   Smokeless tobacco: Never  Vaping Use   Vaping status: Never Used  Substance Use Topics   Alcohol use: No    Alcohol/week: 0.0 standard drinks of alcohol   Drug use: No     Colonoscopy:  PAP:  Bone density:  Lipid panel:  Allergies  Allergen Reactions   Sulfa Antibiotics Hives and Swelling    Current Outpatient Medications  Medication Sig Dispense Refill   atorvastatin (LIPITOR) 40 MG tablet Take 40  mg by mouth at bedtime.     enoxaparin (LOVENOX) 40 MG/0.4ML injection Inject 40 mg into the skin.     dexamethasone (DECADRON) 4 MG tablet Take 2 tablets (8 mg total) by mouth daily. Start the day after chemotherapy for 2 days. Take with food. (Patient not taking: Reported on 10/19/2023) 30 tablet 1   ferrous sulfate ER (SLOW FE) 142 (45 Fe) MG TBCR tablet Take 1  tablet by mouth 2 (two) times daily. (Patient not taking: Reported on 07/27/2023)     lidocaine-prilocaine (EMLA) cream Apply to affected area once (Patient not taking: Reported on 10/19/2023) 30 g 3   Omega-3 Fatty Acids (FISH OIL) 1000 MG CAPS Take 1 capsule by mouth daily. (Patient not taking: Reported on 07/13/2023)     ondansetron (ZOFRAN) 4 MG tablet Take 1 tablet (4 mg total) by mouth daily as needed for nausea or vomiting. (Patient not taking: Reported on 06/08/2023) 10 tablet 1   ondansetron (ZOFRAN) 8 MG tablet Take 1 tablet (8 mg total) by mouth every 8 (eight) hours as needed for nausea or vomiting. Start on the third day after chemotherapy. (Patient not taking: Reported on 06/08/2023) 30 tablet 1   oxyCODONE-acetaminophen (PERCOCET) 5-325 MG tablet Take 1 tablet by mouth every 4 (four) hours as needed for severe pain. (Patient not taking: Reported on 05/18/2023) 20 tablet 0   prochlorperazine (COMPAZINE) 10 MG tablet Take 1 tablet (10 mg total) by mouth every 6 (six) hours as needed for nausea or vomiting. (Patient not taking: Reported on 06/08/2023) 30 tablet 1   No current facility-administered medications for this visit.    OBJECTIVE: Vitals:   10/19/23 0914  BP: (!) 142/63  Pulse: 80  Resp: 16  Temp: (!) 96.6 F (35.9 C)  SpO2: 100%     Body mass index is 18.42 kg/m.    ECOG FS:0 - Asymptomatic  General: Well-developed, well-nourished, no acute distress. Eyes: Pink conjunctiva, anicteric sclera. HEENT: Normocephalic, moist mucous membranes. Lungs: No audible wheezing or coughing. Heart: Regular rate and rhythm. Abdomen: Soft, nontender, no obvious distention. Musculoskeletal: No edema, cyanosis, or clubbing. Neuro: Alert, answering all questions appropriately. Cranial nerves grossly intact. Skin: No rashes or petechiae noted. Psych: Normal affect.  LAB RESULTS:  Lab Results  Component Value Date   NA 137 10/19/2023   K 3.5 10/19/2023   CL 102 10/19/2023   CO2 27  10/19/2023   GLUCOSE 107 (H) 10/19/2023   BUN 16 10/19/2023   CREATININE 0.54 10/19/2023   CALCIUM 9.0 10/19/2023   PROT 7.2 10/19/2023   ALBUMIN 3.7 10/19/2023   AST 25 10/19/2023   ALT 26 10/19/2023   ALKPHOS 104 10/19/2023   BILITOT 0.8 10/19/2023   GFRNONAA >60 10/19/2023    Lab Results  Component Value Date   WBC 5.9 10/19/2023   NEUTROABS 3.8 10/19/2023   HGB 11.1 (L) 10/19/2023   HCT 33.3 (L) 10/19/2023   MCV 100.0 10/19/2023   PLT 287 10/19/2023     STUDIES: No results found.   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  MRI of the liver on May 06, 2023 showed a 2.4 cm lesion in the liver which biopsy confirmed an isolated metastatic lesion.  Consultation at [facility] recommended adjuvant chemotherapy with FOLFOX for 57-months followed by metastectomy of liver lesion.  CEA is within normal limits at 2.8.  CT scan on August 09, 2023 as well as imaging from [facility] on August 23, 2023 revealed decreased size of known liver lesion with no other evidence of disease.  Patient underwent partial hepatectomy on September 29, 2023 which revealed complete pathologic response.  Recommendation is 3 additional months of FOLFOX.  Return to clinic in 1 week for further evaluation and reinitiation of FOLFOX.   Iron deficiency anemia: Chronic and unchanged.  Patient's hemoglobin is 11.1 today.  Her most recent iron stores on May 18, 2023 were reported as normal.  Patient last received IV Venofer on May 04, 2023. Thrombocytopenia: Resolved. Hypokalemia: Resolved.   Transaminitis: Resolved. Hyperbilirubinemia: Resolved.   Patient expressed understanding and was in agreement with this plan. She also understands that She can call clinic at any time with any  questions, concerns, or complaints.    Cancer Staging  Colon adenocarcinoma [facility]) Staging form: Colon and Rectum, AJCC 8th Edition - Pathologic: pT3, pN2b, pM1 - Signed by Michaelyn Barter, MD on 05/18/2023 Stage prefix: Initial diagnosis Total positive nodes: 7 Total nodes examined: 60 Histologic grading system: 4 grade system Histologic grade (G): G2   Jeralyn Ruths, MD   10/19/2023 9:42 AM

## 2023-10-20 ENCOUNTER — Encounter: Payer: Self-pay | Admitting: Oncology

## 2023-10-20 ENCOUNTER — Other Ambulatory Visit: Payer: Self-pay

## 2023-10-20 LAB — CEA: CEA: 2.4 ng/mL (ref 0.0–4.7)

## 2023-10-28 ENCOUNTER — Inpatient Hospital Stay

## 2023-10-28 ENCOUNTER — Encounter: Payer: Self-pay | Admitting: Oncology

## 2023-10-28 ENCOUNTER — Inpatient Hospital Stay (HOSPITAL_BASED_OUTPATIENT_CLINIC_OR_DEPARTMENT_OTHER): Admitting: Oncology

## 2023-10-28 VITALS — BP 122/70 | HR 73 | Temp 96.5°F | Resp 16 | Ht 65.5 in | Wt 114.0 lb

## 2023-10-28 VITALS — BP 121/65 | HR 82 | Temp 97.7°F | Resp 18

## 2023-10-28 DIAGNOSIS — Z5111 Encounter for antineoplastic chemotherapy: Secondary | ICD-10-CM | POA: Diagnosis not present

## 2023-10-28 DIAGNOSIS — C189 Malignant neoplasm of colon, unspecified: Secondary | ICD-10-CM

## 2023-10-28 LAB — CBC WITH DIFFERENTIAL (CANCER CENTER ONLY)
Abs Immature Granulocytes: 0.04 10*3/uL (ref 0.00–0.07)
Basophils Absolute: 0.1 10*3/uL (ref 0.0–0.1)
Basophils Relative: 1 %
Eosinophils Absolute: 0.2 10*3/uL (ref 0.0–0.5)
Eosinophils Relative: 3 %
HCT: 32.5 % — ABNORMAL LOW (ref 36.0–46.0)
Hemoglobin: 10.9 g/dL — ABNORMAL LOW (ref 12.0–15.0)
Immature Granulocytes: 1 %
Lymphocytes Relative: 26 %
Lymphs Abs: 1.8 10*3/uL (ref 0.7–4.0)
MCH: 33.2 pg (ref 26.0–34.0)
MCHC: 33.5 g/dL (ref 30.0–36.0)
MCV: 99.1 fL (ref 80.0–100.0)
Monocytes Absolute: 0.6 10*3/uL (ref 0.1–1.0)
Monocytes Relative: 9 %
Neutro Abs: 4.2 10*3/uL (ref 1.7–7.7)
Neutrophils Relative %: 60 %
Platelet Count: 208 10*3/uL (ref 150–400)
RBC: 3.28 MIL/uL — ABNORMAL LOW (ref 3.87–5.11)
RDW: 12.7 % (ref 11.5–15.5)
WBC Count: 6.8 10*3/uL (ref 4.0–10.5)
nRBC: 0 % (ref 0.0–0.2)

## 2023-10-28 LAB — CMP (CANCER CENTER ONLY)
ALT: 22 U/L (ref 0–44)
AST: 21 U/L (ref 15–41)
Albumin: 3.9 g/dL (ref 3.5–5.0)
Alkaline Phosphatase: 108 U/L (ref 38–126)
Anion gap: 6 (ref 5–15)
BUN: 16 mg/dL (ref 8–23)
CO2: 25 mmol/L (ref 22–32)
Calcium: 8.6 mg/dL — ABNORMAL LOW (ref 8.9–10.3)
Chloride: 103 mmol/L (ref 98–111)
Creatinine: 0.4 mg/dL — ABNORMAL LOW (ref 0.44–1.00)
GFR, Estimated: >60 mL/min (ref >60)
Glucose, Bld: 101 mg/dL — ABNORMAL HIGH (ref 70–99)
Potassium: 3.7 mmol/L (ref 3.5–5.1)
Sodium: 134 mmol/L — ABNORMAL LOW (ref 135–145)
Total Bilirubin: 0.9 mg/dL (ref 0.0–1.2)
Total Protein: 7.2 g/dL (ref 6.5–8.1)

## 2023-10-28 MED ORDER — LEUCOVORIN CALCIUM INJECTION 350 MG
400.0000 mg/m2 | Freq: Once | INTRAVENOUS | Status: AC
  Administered 2023-10-28: 620 mg via INTRAVENOUS
  Filled 2023-10-28: qty 25

## 2023-10-28 MED ORDER — OXALIPLATIN CHEMO INJECTION 100 MG/20ML
85.0000 mg/m2 | Freq: Once | INTRAVENOUS | Status: AC
  Administered 2023-10-28: 130 mg via INTRAVENOUS
  Filled 2023-10-28: qty 25.99

## 2023-10-28 MED ORDER — PALONOSETRON HCL INJECTION 0.25 MG/5ML
0.2500 mg | Freq: Once | INTRAVENOUS | Status: AC
  Administered 2023-10-28: 0.25 mg via INTRAVENOUS
  Filled 2023-10-28: qty 5

## 2023-10-28 MED ORDER — DEXAMETHASONE SODIUM PHOSPHATE 10 MG/ML IJ SOLN
10.0000 mg | Freq: Once | INTRAMUSCULAR | Status: AC
  Administered 2023-10-28: 10 mg via INTRAVENOUS
  Filled 2023-10-28: qty 1

## 2023-10-28 MED ORDER — DEXTROSE 5 % IV SOLN
Freq: Once | INTRAVENOUS | Status: AC
Start: 2023-10-28 — End: 2023-10-28
  Filled 2023-10-28: qty 250

## 2023-10-28 MED ORDER — SODIUM CHLORIDE 0.9 % IV SOLN
2400.0000 mg/m2 | INTRAVENOUS | Status: DC
  Administered 2023-10-28: 3500 mg via INTRAVENOUS
  Filled 2023-10-28: qty 70

## 2023-10-28 MED ORDER — FLUOROURACIL CHEMO INJECTION 2.5 GM/50ML
400.0000 mg/m2 | Freq: Once | INTRAVENOUS | Status: AC
  Administered 2023-10-28: 600 mg via INTRAVENOUS
  Filled 2023-10-28: qty 12

## 2023-10-28 NOTE — Patient Instructions (Signed)
 [facility]  Discharge Instructions: Thank you for choosing [facility] to provide your oncology and hematology care.  If you have a lab appointment with the Cancer Center, please go directly to the Cancer Center and check in at the registration area.  Wear comfortable clothing and clothing appropriate for easy access to any Portacath or PICC line.   We strive to give you quality time with your provider. You may need to reschedule your appointment if you arrive late (15 or more minutes).  Arriving late affects you and other patients whose appointments are after yours.  Also, if you miss three or more appointments without notifying the office, you may be dismissed from the clinic at the provider's discretion.      For prescription refill requests, have your pharmacy contact our office and allow 72 hours for refills to be completed.    Today you received the following chemotherapy and/or immunotherapy agents oxaliplatin, leucovorin, and adrucil      To help prevent nausea and vomiting after your treatment, we encourage you to take your nausea medication as directed.  BELOW ARE SYMPTOMS THAT SHOULD BE REPORTED IMMEDIATELY: *FEVER GREATER THAN 100.4 F (38 C) OR HIGHER *CHILLS OR SWEATING *NAUSEA AND VOMITING THAT IS NOT CONTROLLED WITH YOUR NAUSEA MEDICATION *UNUSUAL SHORTNESS OF BREATH *UNUSUAL BRUISING OR BLEEDING *URINARY PROBLEMS (pain or burning when urinating, or frequent urination) *BOWEL PROBLEMS (unusual diarrhea, constipation, pain near the anus) TENDERNESS IN MOUTH AND THROAT WITH OR WITHOUT PRESENCE OF ULCERS (sore throat, sores in mouth, or a toothache) UNUSUAL RASH, SWELLING OR PAIN  UNUSUAL VAGINAL DISCHARGE OR ITCHING   Items with * indicate a potential emergency and should be followed up as soon as possible or go to the Emergency Department if any problems should occur.  Please show the CHEMOTHERAPY  ALERT CARD or IMMUNOTHERAPY ALERT CARD at check-in to the Emergency Department and triage nurse.  Should you have questions after your visit or need to cancel or reschedule your appointment, please contact [facility] - A DEPT OF [facility]  [phone] and follow the prompts.  Office hours are 8:00 a.m. to 4:30 p.m. Monday - Friday. Please note that voicemails left after 4:00 p.m. may not be returned until the following business day.  We are closed weekends and major holidays. You have access to a nurse at all times for urgent questions. Please call the main number to the clinic [phone] and follow the prompts.  For any non-urgent questions, you may also contact your provider using MyChart. We now offer e-Visits for anyone 1 and older to request care online for non-urgent symptoms. For details visit [email or web address].   Also download the MyChart app! Go to the app store, search "MyChart", open the app, select [place], and log in with your MyChart username and password.

## 2023-10-28 NOTE — Progress Notes (Signed)
  [facility]  Telephone:(336) [phone] Fax:(336) [phone]  ID: Shari Melendez OB: 08/03/1961  MR#: [id number]  QIO#:[id number]  Patient Care Team: Leim Fabry, MD as PCP - General (Family Medicine) Benita Gutter, RN as Oncology Nurse Navigator Orlie Dakin, Tollie Pizza, MD as Consulting Physician (Oncology)  CHIEF COMPLAINT: Stage IV adenocarcinoma of the colon with isolated liver metastasis.  INTERVAL HISTORY: Patient returns to clinic today for further evaluation and initiation of adjuvant FOLFOX.  She currently feels well and is asymptomatic.  She has no neurologic complaints.  She denies any recent fevers or illnesses.  She has a good appetite and denies weight loss.  She has no chest pain, shortness of breath, cough, or hemoptysis.  She denies any nausea, vomiting, constipation, or diarrhea.  She has no melena or hematochezia.  She has no urinary complaints.  Patient offers no specific complaints today.  REVIEW OF SYSTEMS:   Review of Systems  Constitutional: Negative.  Negative for fever, malaise/fatigue and weight loss.  Respiratory: Negative.  Negative for cough, hemoptysis and shortness of breath.   Cardiovascular: Negative.  Negative for chest pain and leg swelling.  Gastrointestinal: Negative.  Negative for abdominal pain, blood in stool and melena.  Genitourinary: Negative.  Negative for dysuria.  Musculoskeletal: Negative.  Negative for back pain.  Skin: Negative.  Negative for rash.  Neurological: Negative.  Negative for dizziness, focal weakness, weakness and headaches.  Psychiatric/Behavioral: Negative.  The patient is not nervous/anxious.     As per HPI. Otherwise, a complete review of systems is negative.  PAST MEDICAL HISTORY: Past Medical History:  Diagnosis Date   Adenocarcinoma of colon (HCC) 04/03/2023   a.) 4 x 3 cm cecal mass identified on colonoscopy during workup for IDA. Bx 04/02/2020 --> moderately differentiated adenocarcinoma; MMR  stable.   Aortic atherosclerosis (HCC)    Arteriosclerotic cardiovascular disease    Carpal tunnel syndrome    Current smoker    Hiatal hernia    Hypercholesteremia    IDA (iron deficiency anemia)     PAST SURGICAL HISTORY: Past Surgical History:  Procedure Laterality Date   COLONOSCOPY     ESOPHAGOGASTRODUODENOSCOPY     PORTACATH PLACEMENT N/A 05/29/2023   Procedure: INSERTION PORT-A-CATH;  Surgeon: Carolan Shiver, MD;  Location: [facility] ORS;  Service: General;  Laterality: N/A;    FAMILY HISTORY: Family History  Problem Relation Age of Onset   Breast cancer Mother 87   Heart disease Father    Breast cancer Maternal Aunt 5    ADVANCED DIRECTIVES (Y/N):  N  HEALTH MAINTENANCE: Social History   Tobacco Use   Smoking status: Former    Current packs/day: 0.00    Average packs/day: 1 pack/day for 38.0 years (38.0 ttl pk-yrs)    Types: Cigarettes    Quit date: 04/03/2023    Years since quitting: 0.5   Smokeless tobacco: Never  Vaping Use   Vaping status: Never Used  Substance Use Topics   Alcohol use: No    Alcohol/week: 0.0 standard drinks of alcohol   Drug use: No     Colonoscopy:  PAP:  Bone density:  Lipid panel:  Allergies  Allergen Reactions   Sulfa Antibiotics Hives and Swelling    Current Outpatient Medications  Medication Sig Dispense Refill   atorvastatin (LIPITOR) 40 MG tablet Take 40 mg by mouth at bedtime.     enoxaparin (LOVENOX) 40 MG/0.4ML injection Inject 40 mg into the skin.     dexamethasone (DECADRON) 4 MG tablet Take  2 tablets (8 mg total) by mouth daily. Start the day after chemotherapy for 2 days. Take with food. (Patient not taking: Reported on 10/28/2023) 30 tablet 1   ferrous sulfate ER (SLOW FE) 142 (45 Fe) MG TBCR tablet Take 1 tablet by mouth 2 (two) times daily. (Patient not taking: Reported on 10/28/2023)     lidocaine-prilocaine (EMLA) cream Apply to affected area once (Patient not taking: Reported on 10/28/2023) 30 g 3    Omega-3 Fatty Acids (FISH OIL) 1000 MG CAPS Take 1 capsule by mouth daily. (Patient not taking: Reported on 10/28/2023)     ondansetron (ZOFRAN) 4 MG tablet Take 1 tablet (4 mg total) by mouth daily as needed for nausea or vomiting. (Patient not taking: Reported on 10/28/2023) 10 tablet 1   ondansetron (ZOFRAN) 8 MG tablet Take 1 tablet (8 mg total) by mouth every 8 (eight) hours as needed for nausea or vomiting. Start on the third day after chemotherapy. (Patient not taking: Reported on 10/28/2023) 30 tablet 1   oxyCODONE-acetaminophen (PERCOCET) 5-325 MG tablet Take 1 tablet by mouth every 4 (four) hours as needed for severe pain. (Patient not taking: Reported on 10/28/2023) 20 tablet 0   prochlorperazine (COMPAZINE) 10 MG tablet Take 1 tablet (10 mg total) by mouth every 6 (six) hours as needed for nausea or vomiting. (Patient not taking: Reported on 10/28/2023) 30 tablet 1   No current facility-administered medications for this visit.    OBJECTIVE: Vitals:   10/28/23 1033  BP: 122/70  Pulse: 73  Resp: 16  Temp: (!) 96.5 F (35.8 C)  SpO2: 99%     Body mass index is 18.68 kg/m.    ECOG FS:0 - Asymptomatic  General: Well-developed, well-nourished, no acute distress. Eyes: Pink conjunctiva, anicteric sclera. HEENT: Normocephalic, moist mucous membranes. Lungs: No audible wheezing or coughing. Heart: Regular rate and rhythm. Abdomen: Soft, nontender, no obvious distention. Musculoskeletal: No edema, cyanosis, or clubbing. Neuro: Alert, answering all questions appropriately. Cranial nerves grossly intact. Skin: No rashes or petechiae noted. Psych: Normal affect.  LAB RESULTS:  Lab Results  Component Value Date   NA 134 (L) 10/28/2023   K 3.7 10/28/2023   CL 103 10/28/2023   CO2 25 10/28/2023   GLUCOSE 101 (H) 10/28/2023   BUN 16 10/28/2023   CREATININE 0.40 (L) 10/28/2023   CALCIUM 8.6 (L) 10/28/2023   PROT 7.2 10/28/2023   ALBUMIN 3.9 10/28/2023   AST 21 10/28/2023   ALT 22  10/28/2023   ALKPHOS 108 10/28/2023   BILITOT 0.9 10/28/2023   GFRNONAA >60 10/28/2023    Lab Results  Component Value Date   WBC 6.8 10/28/2023   NEUTROABS 4.2 10/28/2023   HGB 10.9 (L) 10/28/2023   HCT 32.5 (L) 10/28/2023   MCV 99.1 10/28/2023   PLT 208 10/28/2023     STUDIES: No results found.   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  MRI of the liver on May 06, 2023 showed a 2.4 cm lesion in the liver which biopsy confirmed an isolated metastatic lesion.  Consultation at [facility] recommended adjuvant chemotherapy with FOLFOX for 19-months followed by metastectomy of liver lesion.  CEA is within normal limits at 2.8.  CT scan on August 09, 2023 as well as imaging from [facility] on August 23, 2023 revealed decreased  size of known liver lesion with no other evidence of disease.  Patient underwent partial hepatectomy on September 29, 2023 which revealed complete pathologic response.  Recommendation is 3 additional months of FOLFOX.  Proceed with cycle 8 of treatment today.  Return to clinic in 2 days for pump removal and then in 2 weeks for consideration of cycle 9.   Iron deficiency anemia: Chronic and unchanged.  Patient's hemoglobin is 10.9 today.  Her most recent iron stores on May 18, 2023 were reported as normal.  Patient last received IV Venofer on May 04, 2023.  I spent a total of 30 minutes reviewing chart data, face-to-face evaluation with the patient, counseling and coordination of care as detailed above.    Patient expressed understanding and was in agreement with this plan. She also understands that She can call clinic at any time with any questions, concerns, or complaints.    Cancer Staging  Colon adenocarcinoma [facility]) Staging form:  Colon and Rectum, AJCC 8th Edition - Pathologic: pT3, pN2b, pM1 - Signed by Michaelyn Barter, MD on 05/18/2023 Stage prefix: Initial diagnosis Total positive nodes: 7 Total nodes examined: 60 Histologic grading system: 4 grade system Histologic grade (G): G2   Jeralyn Ruths, MD   10/28/2023 11:02 AM

## 2023-10-30 ENCOUNTER — Encounter: Payer: Self-pay | Admitting: Oncology

## 2023-10-30 ENCOUNTER — Inpatient Hospital Stay

## 2023-10-30 VITALS — BP 139/73 | HR 84 | Resp 18

## 2023-10-30 DIAGNOSIS — Z5111 Encounter for antineoplastic chemotherapy: Secondary | ICD-10-CM | POA: Diagnosis not present

## 2023-10-30 DIAGNOSIS — C189 Malignant neoplasm of colon, unspecified: Secondary | ICD-10-CM

## 2023-10-30 MED ORDER — SODIUM CHLORIDE 0.9% FLUSH
10.0000 mL | INTRAVENOUS | Status: DC | PRN
Start: 2023-10-30 — End: 2023-10-30
  Administered 2023-10-30: 10 mL
  Filled 2023-10-30: qty 10

## 2023-10-30 MED ORDER — HEPARIN SOD (PORK) LOCK FLUSH 100 UNIT/ML IV SOLN
500.0000 [IU] | Freq: Once | INTRAVENOUS | Status: AC | PRN
  Administered 2023-10-30: 500 [IU]
  Filled 2023-10-30: qty 5

## 2023-11-05 ENCOUNTER — Encounter: Payer: Self-pay | Admitting: Oncology

## 2023-11-09 ENCOUNTER — Encounter: Payer: Self-pay | Admitting: Oncology

## 2023-11-09 ENCOUNTER — Inpatient Hospital Stay (HOSPITAL_BASED_OUTPATIENT_CLINIC_OR_DEPARTMENT_OTHER): Admitting: Oncology

## 2023-11-09 ENCOUNTER — Inpatient Hospital Stay

## 2023-11-09 VITALS — BP 120/61 | HR 88 | Temp 96.0°F | Resp 18

## 2023-11-09 VITALS — BP 120/66 | HR 74 | Temp 96.8°F | Resp 16 | Ht 65.5 in | Wt 112.8 lb

## 2023-11-09 DIAGNOSIS — Z5111 Encounter for antineoplastic chemotherapy: Secondary | ICD-10-CM | POA: Diagnosis not present

## 2023-11-09 DIAGNOSIS — C189 Malignant neoplasm of colon, unspecified: Secondary | ICD-10-CM

## 2023-11-09 LAB — CBC WITH DIFFERENTIAL (CANCER CENTER ONLY)
Abs Immature Granulocytes: 0.02 10*3/uL (ref 0.00–0.07)
Basophils Absolute: 0 10*3/uL (ref 0.0–0.1)
Basophils Relative: 1 %
Eosinophils Absolute: 0.1 10*3/uL (ref 0.0–0.5)
Eosinophils Relative: 1 %
HCT: 33.4 % — ABNORMAL LOW (ref 36.0–46.0)
Hemoglobin: 11.3 g/dL — ABNORMAL LOW (ref 12.0–15.0)
Immature Granulocytes: 0 %
Lymphocytes Relative: 28 %
Lymphs Abs: 1.5 10*3/uL (ref 0.7–4.0)
MCH: 33.1 pg (ref 26.0–34.0)
MCHC: 33.8 g/dL (ref 30.0–36.0)
MCV: 97.9 fL (ref 80.0–100.0)
Monocytes Absolute: 0.6 10*3/uL (ref 0.1–1.0)
Monocytes Relative: 11 %
Neutro Abs: 3.1 10*3/uL (ref 1.7–7.7)
Neutrophils Relative %: 59 %
Platelet Count: 199 10*3/uL (ref 150–400)
RBC: 3.41 MIL/uL — ABNORMAL LOW (ref 3.87–5.11)
RDW: 12.7 % (ref 11.5–15.5)
WBC Count: 5.3 10*3/uL (ref 4.0–10.5)
nRBC: 0 % (ref 0.0–0.2)

## 2023-11-09 LAB — CMP (CANCER CENTER ONLY)
ALT: 23 U/L (ref 0–44)
AST: 24 U/L (ref 15–41)
Albumin: 4 g/dL (ref 3.5–5.0)
Alkaline Phosphatase: 100 U/L (ref 38–126)
Anion gap: 8 (ref 5–15)
BUN: 16 mg/dL (ref 8–23)
CO2: 25 mmol/L (ref 22–32)
Calcium: 8.9 mg/dL (ref 8.9–10.3)
Chloride: 101 mmol/L (ref 98–111)
Creatinine: 0.73 mg/dL (ref 0.44–1.00)
GFR, Estimated: >60 mL/min (ref >60)
Glucose, Bld: 121 mg/dL — ABNORMAL HIGH (ref 70–99)
Potassium: 3.5 mmol/L (ref 3.5–5.1)
Sodium: 134 mmol/L — ABNORMAL LOW (ref 135–145)
Total Bilirubin: 0.8 mg/dL (ref 0.0–1.2)
Total Protein: 7.2 g/dL (ref 6.5–8.1)

## 2023-11-09 MED ORDER — SODIUM CHLORIDE 0.9 % IV SOLN
2400.0000 mg/m2 | INTRAVENOUS | Status: DC
  Administered 2023-11-09: 3500 mg via INTRAVENOUS
  Filled 2023-11-09: qty 70

## 2023-11-09 MED ORDER — DEXTROSE 5 % IV SOLN
Freq: Once | INTRAVENOUS | Status: AC
  Filled 2023-11-09: qty 250

## 2023-11-09 MED ORDER — OXALIPLATIN CHEMO INJECTION 100 MG/20ML
85.0000 mg/m2 | Freq: Once | INTRAVENOUS | Status: AC
  Administered 2023-11-09: 130 mg via INTRAVENOUS
  Filled 2023-11-09: qty 26

## 2023-11-09 MED ORDER — PALONOSETRON HCL INJECTION 0.25 MG/5ML
0.2500 mg | Freq: Once | INTRAVENOUS | Status: AC
  Administered 2023-11-09: 0.25 mg via INTRAVENOUS
  Filled 2023-11-09: qty 5

## 2023-11-09 MED ORDER — FLUOROURACIL CHEMO INJECTION 2.5 GM/50ML
400.0000 mg/m2 | Freq: Once | INTRAVENOUS | Status: AC
  Administered 2023-11-09: 600 mg via INTRAVENOUS
  Filled 2023-11-09: qty 12

## 2023-11-09 MED ORDER — DEXAMETHASONE SODIUM PHOSPHATE 10 MG/ML IJ SOLN
10.0000 mg | Freq: Once | INTRAMUSCULAR | Status: AC
  Administered 2023-11-09: 10 mg via INTRAVENOUS
  Filled 2023-11-09: qty 1

## 2023-11-09 MED ORDER — LEUCOVORIN CALCIUM INJECTION 350 MG
400.0000 mg/m2 | Freq: Once | INTRAVENOUS | Status: AC
  Administered 2023-11-09: 620 mg via INTRAVENOUS
  Filled 2023-11-09: qty 31

## 2023-11-09 NOTE — Patient Instructions (Signed)
 [facility]  Discharge Instructions: Thank you for choosing [facility] to provide your oncology and hematology care.  If you have a lab appointment with the Cancer Center, please go directly to the Cancer Center and check in at the registration area.  Wear comfortable clothing and clothing appropriate for easy access to any Portacath or PICC line.   We strive to give you quality time with your provider. You may need to reschedule your appointment if you arrive late (15 or more minutes).  Arriving late affects you and other patients whose appointments are after yours.  Also, if you miss three or more appointments without notifying the office, you may be dismissed from the clinic at the provider's discretion.      For prescription refill requests, have your pharmacy contact our office and allow 72 hours for refills to be completed.    Today you received the following chemotherapy and/or immunotherapy agents oxaliplatin, leucovorin, adrucil      To help prevent nausea and vomiting after your treatment, we encourage you to take your nausea medication as directed.  BELOW ARE SYMPTOMS THAT SHOULD BE REPORTED IMMEDIATELY: *FEVER GREATER THAN 100.4 F (38 C) OR HIGHER *CHILLS OR SWEATING *NAUSEA AND VOMITING THAT IS NOT CONTROLLED WITH YOUR NAUSEA MEDICATION *UNUSUAL SHORTNESS OF BREATH *UNUSUAL BRUISING OR BLEEDING *URINARY PROBLEMS (pain or burning when urinating, or frequent urination) *BOWEL PROBLEMS (unusual diarrhea, constipation, pain near the anus) TENDERNESS IN MOUTH AND THROAT WITH OR WITHOUT PRESENCE OF ULCERS (sore throat, sores in mouth, or a toothache) UNUSUAL RASH, SWELLING OR PAIN  UNUSUAL VAGINAL DISCHARGE OR ITCHING   Items with * indicate a potential emergency and should be followed up as soon as possible or go to the Emergency Department if any problems should occur.  Please show the CHEMOTHERAPY ALERT  CARD or IMMUNOTHERAPY ALERT CARD at check-in to the Emergency Department and triage nurse.  Should you have questions after your visit or need to cancel or reschedule your appointment, please contact [facility] - A DEPT OF [facility]  [phone] and follow the prompts.  Office hours are 8:00 a.m. to 4:30 p.m. Monday - Friday. Please note that voicemails left after 4:00 p.m. may not be returned until the following business day.  We are closed weekends and major holidays. You have access to a nurse at all times for urgent questions. Please call the main number to the clinic [phone] and follow the prompts.  For any non-urgent questions, you may also contact your provider using MyChart. We now offer e-Visits for anyone 24 and older to request care online for non-urgent symptoms. For details visit [email or web address].   Also download the MyChart app! Go to the app store, search "MyChart", open the app, select [place], and log in with your MyChart username and password.

## 2023-11-09 NOTE — Progress Notes (Signed)
 [facility]  [phone] Fax:([phone]  ID: Shari Melendez OB: 1961-04-22  MR#: [id number]  OVF#:[id number]  Patient Care Team: Leim Fabry, MD as PCP - General (Family Medicine) Benita Gutter, RN as Oncology Nurse Navigator Orlie Dakin, Tollie Pizza, MD as Consulting Physician (Oncology)  CHIEF COMPLAINT: Stage IV adenocarcinoma of the colon with isolated liver metastasis.  INTERVAL HISTORY: Patient returns to clinic today for further evaluation and consideration of cycle 9 of adjuvant FOLFOX.  She continues to feel well and remains asymptomatic.  She is tolerating her treatments without significant side effects. She has no neurologic complaints.  She denies any recent fevers or illnesses.  She has a good appetite and denies weight loss.  She has no chest pain, shortness of breath, cough, or hemoptysis.  She denies any nausea, vomiting, constipation, or diarrhea.  She has no melena or hematochezia.  She has no urinary complaints.  Patient offers no specific complaints today.  REVIEW OF SYSTEMS:   Review of Systems  Constitutional: Negative.  Negative for fever, malaise/fatigue and weight loss.  Respiratory: Negative.  Negative for cough, hemoptysis and shortness of breath.   Cardiovascular: Negative.  Negative for chest pain and leg swelling.  Gastrointestinal: Negative.  Negative for abdominal pain, blood in stool and melena.  Genitourinary: Negative.  Negative for dysuria.  Musculoskeletal: Negative.  Negative for back pain.  Skin: Negative.  Negative for rash.  Neurological: Negative.  Negative for dizziness, focal weakness, weakness and headaches.  Psychiatric/Behavioral: Negative.  The patient is not nervous/anxious.     As per HPI. Otherwise, a complete review of systems is negative.  PAST MEDICAL HISTORY: Past Medical History:  Diagnosis Date   Adenocarcinoma of colon (HCC) 04/03/2023   a.) 4 x 3 cm cecal mass identified on colonoscopy  during workup for IDA. Bx 04/02/2020 --> moderately differentiated adenocarcinoma; MMR stable.   Aortic atherosclerosis (HCC)    Arteriosclerotic cardiovascular disease    Carpal tunnel syndrome    Current smoker    Hiatal hernia    Hypercholesteremia    IDA (iron deficiency anemia)     PAST SURGICAL HISTORY: Past Surgical History:  Procedure Laterality Date   COLONOSCOPY     ESOPHAGOGASTRODUODENOSCOPY     PORTACATH PLACEMENT N/A 05/29/2023   Procedure: INSERTION PORT-A-CATH;  Surgeon: Carolan Shiver, MD;  Location: [facility] ORS;  Service: General;  Laterality: N/A;    FAMILY HISTORY: Family History  Problem Relation Age of Onset   Breast cancer Mother 22   Heart disease Father    Breast cancer Maternal Aunt 50    ADVANCED DIRECTIVES (Y/N):  N  HEALTH MAINTENANCE: Social History   Tobacco Use   Smoking status: Former    Current packs/day: 0.00    Average packs/day: 1 pack/day for 38.0 years (38.0 ttl pk-yrs)    Types: Cigarettes    Quit date: 04/03/2023    Years since quitting: 0.6   Smokeless tobacco: Never  Vaping Use   Vaping status: Never Used  Substance Use Topics   Alcohol use: No    Alcohol/week: 0.0 standard drinks of alcohol   Drug use: No     Colonoscopy:  PAP:  Bone density:  Lipid panel:  Allergies  Allergen Reactions   Sulfa Antibiotics Hives and Swelling    Current Outpatient Medications  Medication Sig Dispense Refill   atorvastatin (LIPITOR) 40 MG tablet Take 40 mg by mouth at bedtime.     dexamethasone (DECADRON) 4 MG tablet Take 2 tablets (  8 mg total) by mouth daily. Start the day after chemotherapy for 2 days. Take with food. (Patient not taking: Reported on 11/09/2023) 30 tablet 1   ferrous sulfate ER (SLOW FE) 142 (45 Fe) MG TBCR tablet Take 1 tablet by mouth 2 (two) times daily. (Patient not taking: Reported on 11/09/2023)     lidocaine-prilocaine (EMLA) cream Apply to affected area once (Patient not taking: Reported on 11/09/2023)  30 g 3   Omega-3 Fatty Acids (FISH OIL) 1000 MG CAPS Take 1 capsule by mouth daily. (Patient not taking: Reported on 11/09/2023)     ondansetron (ZOFRAN) 4 MG tablet Take 1 tablet (4 mg total) by mouth daily as needed for nausea or vomiting. (Patient not taking: Reported on 10/28/2023) 10 tablet 1   ondansetron (ZOFRAN) 8 MG tablet Take 1 tablet (8 mg total) by mouth every 8 (eight) hours as needed for nausea or vomiting. Start on the third day after chemotherapy. (Patient not taking: Reported on 11/09/2023) 30 tablet 1   oxyCODONE-acetaminophen (PERCOCET) 5-325 MG tablet Take 1 tablet by mouth every 4 (four) hours as needed for severe pain. (Patient not taking: Reported on 10/28/2023) 20 tablet 0   prochlorperazine (COMPAZINE) 10 MG tablet Take 1 tablet (10 mg total) by mouth every 6 (six) hours as needed for nausea or vomiting. (Patient not taking: Reported on 11/09/2023) 30 tablet 1   No current facility-administered medications for this visit.    OBJECTIVE: Vitals:   11/09/23 0832  BP: 120/66  Pulse: 74  Resp: 16  Temp: (!) 96.8 F (36 C)  SpO2: 100%     Body mass index is 18.49 kg/m.    ECOG FS:0 - Asymptomatic  General: Well-developed, well-nourished, no acute distress. Eyes: Pink conjunctiva, anicteric sclera. HEENT: Normocephalic, moist mucous membranes. Lungs: No audible wheezing or coughing. Heart: Regular rate and rhythm. Abdomen: Soft, nontender, no obvious distention. Musculoskeletal: No edema, cyanosis, or clubbing. Neuro: Alert, answering all questions appropriately. Cranial nerves grossly intact. Skin: No rashes or petechiae noted. Psych: Normal affect.  LAB RESULTS:  Lab Results  Component Value Date   NA 134 (L) 10/28/2023   K 3.7 10/28/2023   CL 103 10/28/2023   CO2 25 10/28/2023   GLUCOSE 101 (H) 10/28/2023   BUN 16 10/28/2023   CREATININE 0.40 (L) 10/28/2023   CALCIUM 8.6 (L) 10/28/2023   PROT 7.2 10/28/2023   ALBUMIN 3.9 10/28/2023   AST 21 10/28/2023    ALT 22 10/28/2023   ALKPHOS 108 10/28/2023   BILITOT 0.9 10/28/2023   GFRNONAA >60 10/28/2023    Lab Results  Component Value Date   WBC 5.3 11/09/2023   NEUTROABS 3.1 11/09/2023   HGB 11.3 (L) 11/09/2023   HCT 33.4 (L) 11/09/2023   MCV 97.9 11/09/2023   PLT 199 11/09/2023     STUDIES: No results found.   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  MRI of the liver on May 06, 2023 showed a 2.4 cm lesion in the liver which biopsy confirmed an isolated metastatic lesion.  Consultation at [facility] recommended adjuvant chemotherapy with FOLFOX for 70-months followed by metastectomy of liver lesion.  CEA is within normal limits at 2.8.  CT scan on August 09, 2023 as well as imaging from [facility] on August 23, 2023 revealed decreased size of  known liver lesion with no other evidence of disease.  Patient underwent partial hepatectomy on September 29, 2023 which revealed complete pathologic response.  Recommendation is 3 additional months of FOLFOX.  Proceed with cycle 9 of 12 of treatment today.  Return to clinic in 2 days for pump removal and then in 2 weeks for further evaluation and consideration of cycle 10. Iron deficiency anemia: Mildly improved.  Patient's hemoglobin is 11.3 today. Her most recent iron stores on May 18, 2023 were reported as normal.  Patient last received IV Venofer on May 04, 2023. Treatment: Mild, monitor.  Patient sodium level is stable at 134.  I spent a total of 30 minutes reviewing chart data, face-to-face evaluation with the patient, counseling and coordination of care as detailed above.   Patient expressed understanding and was in agreement with this plan. She also understands that She can call clinic at any time with any  questions, concerns, or complaints.    Cancer Staging  Colon adenocarcinoma [facility]) Staging form: Colon and Rectum, AJCC 8th Edition - Pathologic: pT3, pN2b, pM1 - Signed by Michaelyn Barter, MD on 05/18/2023 Stage prefix: Initial diagnosis Total positive nodes: 7 Total nodes examined: 60 Histologic grading system: 4 grade system Histologic grade (G): G2   Jeralyn Ruths, MD   11/09/2023 8:58 AM

## 2023-11-10 ENCOUNTER — Ambulatory Visit
Admission: RE | Admit: 2023-11-10 | Discharge: 2023-11-10 | Disposition: A | Discharge status: Home / Self Care | Payer: No Typology Code available for payment source | Source: Ambulatory Visit | Attending: Family Medicine | Admitting: Family Medicine

## 2023-11-10 DIAGNOSIS — Z1231 Encounter for screening mammogram for malignant neoplasm of breast: Secondary | ICD-10-CM | POA: Diagnosis present

## 2023-11-11 ENCOUNTER — Inpatient Hospital Stay

## 2023-11-11 VITALS — BP 133/63 | HR 98 | Temp 98.3°F

## 2023-11-11 DIAGNOSIS — Z5111 Encounter for antineoplastic chemotherapy: Secondary | ICD-10-CM | POA: Diagnosis not present

## 2023-11-11 DIAGNOSIS — C189 Malignant neoplasm of colon, unspecified: Secondary | ICD-10-CM

## 2023-11-11 MED ORDER — SODIUM CHLORIDE 0.9% FLUSH
10.0000 mL | INTRAVENOUS | Status: DC | PRN
  Administered 2023-11-11: 10 mL
  Filled 2023-11-11: qty 10

## 2023-11-11 MED ORDER — HEPARIN SOD (PORK) LOCK FLUSH 100 UNIT/ML IV SOLN
500.0000 [IU] | Freq: Once | INTRAVENOUS | Status: AC | PRN
Start: 2023-11-11 — End: 2023-11-11
  Administered 2023-11-11: 500 [IU]
  Filled 2023-11-11: qty 5

## 2023-11-18 ENCOUNTER — Other Ambulatory Visit: Payer: Self-pay

## 2023-11-23 ENCOUNTER — Inpatient Hospital Stay (HOSPITAL_BASED_OUTPATIENT_CLINIC_OR_DEPARTMENT_OTHER): Admitting: Oncology

## 2023-11-23 ENCOUNTER — Inpatient Hospital Stay: Attending: Internal Medicine

## 2023-11-23 ENCOUNTER — Encounter: Payer: Self-pay | Admitting: Oncology

## 2023-11-23 ENCOUNTER — Inpatient Hospital Stay

## 2023-11-23 VITALS — HR 70

## 2023-11-23 VITALS — BP 127/61 | Temp 98.1°F | Resp 18 | Wt 113.0 lb

## 2023-11-23 DIAGNOSIS — G629 Polyneuropathy, unspecified: Secondary | ICD-10-CM | POA: Insufficient documentation

## 2023-11-23 DIAGNOSIS — Z5111 Encounter for antineoplastic chemotherapy: Secondary | ICD-10-CM | POA: Diagnosis present

## 2023-11-23 DIAGNOSIS — D509 Iron deficiency anemia, unspecified: Secondary | ICD-10-CM | POA: Diagnosis not present

## 2023-11-23 DIAGNOSIS — E876 Hypokalemia: Secondary | ICD-10-CM | POA: Insufficient documentation

## 2023-11-23 DIAGNOSIS — C189 Malignant neoplasm of colon, unspecified: Secondary | ICD-10-CM

## 2023-11-23 DIAGNOSIS — D696 Thrombocytopenia, unspecified: Secondary | ICD-10-CM | POA: Insufficient documentation

## 2023-11-23 DIAGNOSIS — C787 Secondary malignant neoplasm of liver and intrahepatic bile duct: Secondary | ICD-10-CM | POA: Diagnosis not present

## 2023-11-23 DIAGNOSIS — Z87891 Personal history of nicotine dependence: Secondary | ICD-10-CM | POA: Diagnosis not present

## 2023-11-23 DIAGNOSIS — E871 Hypo-osmolality and hyponatremia: Secondary | ICD-10-CM | POA: Diagnosis not present

## 2023-11-23 DIAGNOSIS — Z803 Family history of malignant neoplasm of breast: Secondary | ICD-10-CM | POA: Insufficient documentation

## 2023-11-23 LAB — CBC WITH DIFFERENTIAL (CANCER CENTER ONLY)
Abs Immature Granulocytes: 0.02 10*3/uL (ref 0.00–0.07)
Basophils Absolute: 0.1 10*3/uL (ref 0.0–0.1)
Basophils Relative: 1 %
Eosinophils Absolute: 0 10*3/uL (ref 0.0–0.5)
Eosinophils Relative: 1 %
HCT: 34 % — ABNORMAL LOW (ref 36.0–46.0)
Hemoglobin: 11.4 g/dL — ABNORMAL LOW (ref 12.0–15.0)
Immature Granulocytes: 0 %
Lymphocytes Relative: 24 %
Lymphs Abs: 1.1 10*3/uL (ref 0.7–4.0)
MCH: 32.7 pg (ref 26.0–34.0)
MCHC: 33.5 g/dL (ref 30.0–36.0)
MCV: 97.4 fL (ref 80.0–100.0)
Monocytes Absolute: 0.7 10*3/uL (ref 0.1–1.0)
Monocytes Relative: 16 %
Neutro Abs: 2.7 10*3/uL (ref 1.7–7.7)
Neutrophils Relative %: 58 %
Platelet Count: 136 10*3/uL — ABNORMAL LOW (ref 150–400)
RBC: 3.49 MIL/uL — ABNORMAL LOW (ref 3.87–5.11)
RDW: 13.6 % (ref 11.5–15.5)
WBC Count: 4.6 10*3/uL (ref 4.0–10.5)
nRBC: 0 % (ref 0.0–0.2)

## 2023-11-23 LAB — CMP (CANCER CENTER ONLY)
ALT: 28 U/L (ref 0–44)
AST: 31 U/L (ref 15–41)
Albumin: 4 g/dL (ref 3.5–5.0)
Alkaline Phosphatase: 105 U/L (ref 38–126)
Anion gap: 7 (ref 5–15)
BUN: 15 mg/dL (ref 8–23)
CO2: 25 mmol/L (ref 22–32)
Calcium: 8.8 mg/dL — ABNORMAL LOW (ref 8.9–10.3)
Chloride: 100 mmol/L (ref 98–111)
Creatinine: 0.69 mg/dL (ref 0.44–1.00)
GFR, Estimated: >60 mL/min (ref >60)
Glucose, Bld: 119 mg/dL — ABNORMAL HIGH (ref 70–99)
Potassium: 3.4 mmol/L — ABNORMAL LOW (ref 3.5–5.1)
Sodium: 132 mmol/L — ABNORMAL LOW (ref 135–145)
Total Bilirubin: 1.4 mg/dL — ABNORMAL HIGH (ref 0.0–1.2)
Total Protein: 7.3 g/dL (ref 6.5–8.1)

## 2023-11-23 MED ORDER — DEXTROSE 5 % IV SOLN
85.0000 mg/m2 | Freq: Once | INTRAVENOUS | Status: AC
  Administered 2023-11-23: 130 mg via INTRAVENOUS
  Filled 2023-11-23: qty 6

## 2023-11-23 MED ORDER — DEXTROSE 5 % IV SOLN
Freq: Once | INTRAVENOUS | Status: AC
  Filled 2023-11-23: qty 250

## 2023-11-23 MED ORDER — PALONOSETRON HCL INJECTION 0.25 MG/5ML
0.2500 mg | Freq: Once | INTRAVENOUS | Status: AC
  Administered 2023-11-23: 0.25 mg via INTRAVENOUS
  Filled 2023-11-23: qty 5

## 2023-11-23 MED ORDER — FLUOROURACIL CHEMO INJECTION 2.5 GM/50ML
400.0000 mg/m2 | Freq: Once | INTRAVENOUS | Status: AC
Start: 2023-11-23 — End: 2023-11-23
  Administered 2023-11-23: 600 mg via INTRAVENOUS
  Filled 2023-11-23: qty 12

## 2023-11-23 MED ORDER — SODIUM CHLORIDE 0.9 % IV SOLN
2400.0000 mg/m2 | INTRAVENOUS | Status: DC
  Administered 2023-11-23: 3500 mg via INTRAVENOUS
  Filled 2023-11-23: qty 70

## 2023-11-23 MED ORDER — LEUCOVORIN CALCIUM INJECTION 350 MG
400.0000 mg/m2 | Freq: Once | INTRAVENOUS | Status: AC
  Administered 2023-11-23: 620 mg via INTRAVENOUS
  Filled 2023-11-23: qty 25

## 2023-11-23 MED ORDER — DEXAMETHASONE SODIUM PHOSPHATE 10 MG/ML IJ SOLN
10.0000 mg | Freq: Once | INTRAMUSCULAR | Status: AC
  Administered 2023-11-23: 10 mg via INTRAVENOUS
  Filled 2023-11-23: qty 1

## 2023-11-23 NOTE — Patient Instructions (Signed)
 [facility] - A DEPT OF [facility]  Discharge Instructions: Thank you for choosing [facility] to provide your oncology and hematology care.  If you have a lab appointment with the Cancer Center, please go directly to the Cancer Center and check in at the registration area.  Wear comfortable clothing and clothing appropriate for easy access to any Portacath or PICC line.   We strive to give you quality time with your provider. You may need to reschedule your appointment if you arrive late (15 or more minutes).  Arriving late affects you and other patients whose appointments are after yours.  Also, if you miss three or more appointments without notifying the office, you may be dismissed from the clinic at the provider's discretion.      For prescription refill requests, have your pharmacy contact our office and allow 72 hours for refills to be completed.    Today you received the following chemotherapy and/or immunotherapy agents- oxaliplatin, leucovorin, 5FU      To help prevent nausea and vomiting after your treatment, we encourage you to take your nausea medication as directed.  BELOW ARE SYMPTOMS THAT SHOULD BE REPORTED IMMEDIATELY: *FEVER GREATER THAN 100.4 F (38 C) OR HIGHER *CHILLS OR SWEATING *NAUSEA AND VOMITING THAT IS NOT CONTROLLED WITH YOUR NAUSEA MEDICATION *UNUSUAL SHORTNESS OF BREATH *UNUSUAL BRUISING OR BLEEDING *URINARY PROBLEMS (pain or burning when urinating, or frequent urination) *BOWEL PROBLEMS (unusual diarrhea, constipation, pain near the anus) TENDERNESS IN MOUTH AND THROAT WITH OR WITHOUT PRESENCE OF ULCERS (sore throat, sores in mouth, or a toothache) UNUSUAL RASH, SWELLING OR PAIN  UNUSUAL VAGINAL DISCHARGE OR ITCHING   Items with * indicate a potential emergency and should be followed up as soon as possible or go to the Emergency Department if any problems should occur.  Please show the CHEMOTHERAPY ALERT CARD  or IMMUNOTHERAPY ALERT CARD at check-in to the Emergency Department and triage nurse.  Should you have questions after your visit or need to cancel or reschedule your appointment, please contact [facility] - A DEPT OF [facility]  [phone] and follow the prompts.  Office hours are 8:00 a.m. to 4:30 p.m. Monday - Friday. Please note that voicemails left after 4:00 p.m. may not be returned until the following business day.  We are closed weekends and major holidays. You have access to a nurse at all times for urgent questions. Please call the main number to the clinic [phone] and follow the prompts.  For any non-urgent questions, you may also contact your provider using MyChart. We now offer e-Visits for anyone 66 and older to request care online for non-urgent symptoms. For details visit [email or web address].   Also download the MyChart app! Go to the app store, search "MyChart", open the app, select , and log in with your MyChart username and password.

## 2023-11-23 NOTE — Progress Notes (Signed)
 [facility]  Telephone:(336) [phone] Fax:(336) [phone]  ID: Shari Melendez OB: 07-27-1961  MR#: [id number]  XBM#:[id number]  Patient Care Team: Leim Fabry, MD as PCP - General (Family Medicine) Benita Gutter, RN as Oncology Nurse Navigator Orlie Dakin, Tollie Pizza, MD as Consulting Physician (Oncology)  CHIEF COMPLAINT: Stage IV adenocarcinoma of the colon with isolated liver metastasis.  INTERVAL HISTORY: Patient returns to clinic today for further evaluation and consideration of cycle 10 of adjuvant FOLFOX.  She has a mild peripheral neuropathy, but this does not affect her day-to-day activity.  She otherwise feels well and is asymptomatic.  She is tolerating her treatments without significant side effects.  She has no other neurologic complaints.  She denies any recent fevers or illnesses.  She has a good appetite and denies weight loss.  She has no chest pain, shortness of breath, cough, or hemoptysis.  She denies any nausea, vomiting, constipation, or diarrhea.  She has no melena or hematochezia.  She has no urinary complaints.  Patient offers no further specific complaints today.  REVIEW OF SYSTEMS:   Review of Systems  Constitutional: Negative.  Negative for fever, malaise/fatigue and weight loss.  Respiratory: Negative.  Negative for cough, hemoptysis and shortness of breath.   Cardiovascular: Negative.  Negative for chest pain and leg swelling.  Gastrointestinal: Negative.  Negative for abdominal pain, blood in stool and melena.  Genitourinary: Negative.  Negative for dysuria.  Musculoskeletal: Negative.  Negative for back pain.  Skin: Negative.  Negative for rash.  Neurological:  Positive for sensory change. Negative for dizziness, focal weakness, weakness and headaches.  Psychiatric/Behavioral: Negative.  The patient is not nervous/anxious.     As per HPI. Otherwise, a complete review of systems is negative.  PAST MEDICAL HISTORY: Past Medical  History:  Diagnosis Date   Adenocarcinoma of colon (HCC) 04/03/2023   a.) 4 x 3 cm cecal mass identified on colonoscopy during workup for IDA. Bx 04/02/2020 --> moderately differentiated adenocarcinoma; MMR stable.   Aortic atherosclerosis (HCC)    Arteriosclerotic cardiovascular disease    Carpal tunnel syndrome    Current smoker    Hiatal hernia    Hypercholesteremia    IDA (iron deficiency anemia)     PAST SURGICAL HISTORY: Past Surgical History:  Procedure Laterality Date   COLONOSCOPY     ESOPHAGOGASTRODUODENOSCOPY     PORTACATH PLACEMENT N/A 05/29/2023   Procedure: INSERTION PORT-A-CATH;  Surgeon: Carolan Shiver, MD;  Location: [facility] ORS;  Service: General;  Laterality: N/A;    FAMILY HISTORY: Family History  Problem Relation Age of Onset   Breast cancer Mother 14   Heart disease Father    Breast cancer Maternal Aunt 66    ADVANCED DIRECTIVES (Y/N):  N  HEALTH MAINTENANCE: Social History   Tobacco Use   Smoking status: Former    Current packs/day: 0.00    Average packs/day: 1 pack/day for 38.0 years (38.0 ttl pk-yrs)    Types: Cigarettes    Quit date: 04/03/2023    Years since quitting: 0.6   Smokeless tobacco: Never  Vaping Use   Vaping status: Never Used  Substance Use Topics   Alcohol use: No    Alcohol/week: 0.0 standard drinks of alcohol   Drug use: No     Colonoscopy:  PAP:  Bone density:  Lipid panel:  Allergies  Allergen Reactions   Sulfa Antibiotics Hives and Swelling    Current Outpatient Medications  Medication Sig Dispense Refill   atorvastatin (LIPITOR) 40 MG  tablet Take 40 mg by mouth at bedtime.     dexamethasone (DECADRON) 4 MG tablet Take 2 tablets (8 mg total) by mouth daily. Start the day after chemotherapy for 2 days. Take with food. (Patient not taking: Reported on 11/23/2023) 30 tablet 1   ferrous sulfate ER (SLOW FE) 142 (45 Fe) MG TBCR tablet Take 1 tablet by mouth 2 (two) times daily. (Patient not taking: Reported on  11/23/2023)     lidocaine-prilocaine (EMLA) cream Apply to affected area once (Patient not taking: Reported on 11/23/2023) 30 g 3   Omega-3 Fatty Acids (FISH OIL) 1000 MG CAPS Take 1 capsule by mouth daily. (Patient not taking: Reported on 11/23/2023)     ondansetron (ZOFRAN) 4 MG tablet Take 1 tablet (4 mg total) by mouth daily as needed for nausea or vomiting. (Patient not taking: Reported on 11/23/2023) 10 tablet 1   ondansetron (ZOFRAN) 8 MG tablet Take 1 tablet (8 mg total) by mouth every 8 (eight) hours as needed for nausea or vomiting. Start on the third day after chemotherapy. (Patient not taking: Reported on 11/23/2023) 30 tablet 1   oxyCODONE-acetaminophen (PERCOCET) 5-325 MG tablet Take 1 tablet by mouth every 4 (four) hours as needed for severe pain. (Patient not taking: Reported on 11/23/2023) 20 tablet 0   prochlorperazine (COMPAZINE) 10 MG tablet Take 1 tablet (10 mg total) by mouth every 6 (six) hours as needed for nausea or vomiting. (Patient not taking: Reported on 11/23/2023) 30 tablet 1   No current facility-administered medications for this visit.    OBJECTIVE: Vitals:   11/23/23 0828  BP: 127/61  Resp: 18  Temp: 98.1 F (36.7 C)     Body mass index is 18.52 kg/m.    ECOG FS:0 - Asymptomatic  General: Well-developed, well-nourished, no acute distress. Eyes: Pink conjunctiva, anicteric sclera. HEENT: Normocephalic, moist mucous membranes. Lungs: No audible wheezing or coughing. Heart: Regular rate and rhythm. Abdomen: Soft, nontender, no obvious distention. Musculoskeletal: No edema, cyanosis, or clubbing. Neuro: Alert, answering all questions appropriately. Cranial nerves grossly intact. Skin: No rashes or petechiae noted. Psych: Normal affect.  LAB RESULTS:  Lab Results  Component Value Date   NA 132 (L) 11/23/2023   K 3.4 (L) 11/23/2023   CL 100 11/23/2023   CO2 25 11/23/2023   GLUCOSE 119 (H) 11/23/2023   BUN 15 11/23/2023   CREATININE 0.69 11/23/2023   CALCIUM  8.8 (L) 11/23/2023   PROT 7.3 11/23/2023   ALBUMIN 4.0 11/23/2023   AST 31 11/23/2023   ALT 28 11/23/2023   ALKPHOS 105 11/23/2023   BILITOT 1.4 (H) 11/23/2023   GFRNONAA >60 11/23/2023    Lab Results  Component Value Date   WBC 4.6 11/23/2023   NEUTROABS 2.7 11/23/2023   HGB 11.4 (L) 11/23/2023   HCT 34.0 (L) 11/23/2023   MCV 97.4 11/23/2023   PLT 136 (L) 11/23/2023     STUDIES: MM 3D SCREENING MAMMOGRAM BILATERAL BREAST Result Date: 11/12/2023 CLINICAL DATA:  Screening. EXAM: DIGITAL SCREENING BILATERAL MAMMOGRAM WITH TOMOSYNTHESIS AND CAD TECHNIQUE: Bilateral screening digital craniocaudal and mediolateral oblique mammograms were obtained. Bilateral screening digital breast tomosynthesis was performed. The images were evaluated with computer-aided detection. COMPARISON:  Previous exam(s). ACR Breast Density Category b: There are scattered areas of fibroglandular density. FINDINGS: There are no findings suspicious for malignancy. IMPRESSION: No mammographic evidence of malignancy. A result letter of this screening mammogram will be mailed directly to the patient. RECOMMENDATION: Screening mammogram in one year. (Code:SM-B-01Y) BI-RADS CATEGORY  1: Negative. Electronically Signed   By: Harmon Pier M.D.   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  MRI of the liver on May 06, 2023 showed a 2.4 cm lesion in the liver which biopsy confirmed an isolated metastatic lesion.  Consultation at [facility] recommended adjuvant chemotherapy with FOLFOX for 56-months followed by metastectomy of liver lesion.  CEA is within normal limits at 2.8.  CT scan on August 09, 2023 as well as imaging from [facility] on August 23, 2023 revealed decreased size of  known liver lesion with no other evidence of disease.  Patient underwent partial hepatectomy on September 29, 2023 which revealed complete pathologic response.  Recommendation is 3 additional months of FOLFOX.  Proceed with cycle 10 of 12 of treatment today.  Return to clinic in 2 days for pump removal and then in 2 weeks for further evaluation and consideration of cycle number.   Iron deficiency anemia: Chronic and unchanged.  Patient's hemoglobin is 11.4 today. Her most recent iron stores on May 18, 2023 were reported as normal.  Patient last received IV Venofer on May 04, 2023. Thrombocytopenia: Mild, monitor. Hyponatremia: Patient's sodium level has declined slightly to 132.  Monitor. Hypokalemia: Patient was given dietary recommendations. Hyperbilirubinemia: Mild, monitor. Peripheral neuropathy: Mild, monitor.   Patient expressed understanding and was in agreement with this plan. She also understands that She can call clinic at any time with any questions, concerns, or complaints.    Cancer Staging  Colon adenocarcinoma [facility]) Staging form: Colon and Rectum, AJCC 8th Edition - Pathologic: pT3, pN2b, pM1 - Signed by Michaelyn Barter, MD on 05/18/2023 Stage prefix: Initial diagnosis Total positive nodes: 7 Total nodes examined: 60 Histologic grading system: 4 grade system Histologic grade (G): G2   Jeralyn Ruths, MD   11/23/2023 9:01 AM

## 2023-11-23 NOTE — Progress Notes (Signed)
 Patient here today for follow up regarding colon cancer. Patient reports mild neuropathy in hands and feet, has stayed the same during treatments. Patient denies other concerns today.

## 2023-11-25 ENCOUNTER — Inpatient Hospital Stay

## 2023-11-25 ENCOUNTER — Encounter

## 2023-11-25 DIAGNOSIS — C189 Malignant neoplasm of colon, unspecified: Secondary | ICD-10-CM

## 2023-11-25 DIAGNOSIS — Z5111 Encounter for antineoplastic chemotherapy: Secondary | ICD-10-CM | POA: Diagnosis not present

## 2023-11-25 MED ORDER — HEPARIN SOD (PORK) LOCK FLUSH 100 UNIT/ML IV SOLN
500.0000 [IU] | Freq: Once | INTRAVENOUS | Status: AC | PRN
  Administered 2023-11-25: 500 [IU]
  Filled 2023-11-25: qty 5

## 2023-12-07 ENCOUNTER — Inpatient Hospital Stay

## 2023-12-07 ENCOUNTER — Encounter: Payer: Self-pay | Admitting: Oncology

## 2023-12-07 ENCOUNTER — Inpatient Hospital Stay (HOSPITAL_BASED_OUTPATIENT_CLINIC_OR_DEPARTMENT_OTHER): Admitting: Oncology

## 2023-12-07 VITALS — BP 114/61 | HR 74 | Temp 96.8°F | Resp 16 | Ht 65.5 in | Wt 113.4 lb

## 2023-12-07 DIAGNOSIS — C189 Malignant neoplasm of colon, unspecified: Secondary | ICD-10-CM

## 2023-12-07 DIAGNOSIS — Z5111 Encounter for antineoplastic chemotherapy: Secondary | ICD-10-CM | POA: Diagnosis not present

## 2023-12-07 LAB — CBC WITH DIFFERENTIAL (CANCER CENTER ONLY)
Abs Immature Granulocytes: 0.02 10*3/uL (ref 0.00–0.07)
Basophils Absolute: 0 10*3/uL (ref 0.0–0.1)
Basophils Relative: 1 %
Eosinophils Absolute: 0.1 10*3/uL (ref 0.0–0.5)
Eosinophils Relative: 1 %
HCT: 32.9 % — ABNORMAL LOW (ref 36.0–46.0)
Hemoglobin: 11.1 g/dL — ABNORMAL LOW (ref 12.0–15.0)
Immature Granulocytes: 1 %
Lymphocytes Relative: 32 %
Lymphs Abs: 1.2 10*3/uL (ref 0.7–4.0)
MCH: 32.6 pg (ref 26.0–34.0)
MCHC: 33.7 g/dL (ref 30.0–36.0)
MCV: 96.8 fL (ref 80.0–100.0)
Monocytes Absolute: 0.4 10*3/uL (ref 0.1–1.0)
Monocytes Relative: 12 %
Neutro Abs: 2 10*3/uL (ref 1.7–7.7)
Neutrophils Relative %: 53 %
Platelet Count: 101 10*3/uL — ABNORMAL LOW (ref 150–400)
RBC: 3.4 MIL/uL — ABNORMAL LOW (ref 3.87–5.11)
RDW: 14.3 % (ref 11.5–15.5)
WBC Count: 3.8 10*3/uL — ABNORMAL LOW (ref 4.0–10.5)
nRBC: 0 % (ref 0.0–0.2)

## 2023-12-07 LAB — CMP (CANCER CENTER ONLY)
ALT: 38 U/L (ref 0–44)
AST: 36 U/L (ref 15–41)
Albumin: 3.7 g/dL (ref 3.5–5.0)
Alkaline Phosphatase: 115 U/L (ref 38–126)
Anion gap: 7 (ref 5–15)
BUN: 16 mg/dL (ref 8–23)
CO2: 25 mmol/L (ref 22–32)
Calcium: 8.8 mg/dL — ABNORMAL LOW (ref 8.9–10.3)
Chloride: 106 mmol/L (ref 98–111)
Creatinine: 0.53 mg/dL (ref 0.44–1.00)
GFR, Estimated: >60 mL/min (ref >60)
Glucose, Bld: 118 mg/dL — ABNORMAL HIGH (ref 70–99)
Potassium: 3.4 mmol/L — ABNORMAL LOW (ref 3.5–5.1)
Sodium: 138 mmol/L (ref 135–145)
Total Bilirubin: 1.3 mg/dL — ABNORMAL HIGH (ref 0.0–1.2)
Total Protein: 7 g/dL (ref 6.5–8.1)

## 2023-12-07 MED ORDER — FLUOROURACIL CHEMO INJECTION 2.5 GM/50ML
400.0000 mg/m2 | Freq: Once | INTRAVENOUS | Status: AC
  Administered 2023-12-07: 600 mg via INTRAVENOUS
  Filled 2023-12-07: qty 12

## 2023-12-07 MED ORDER — OXALIPLATIN CHEMO INJECTION 100 MG/20ML
85.0000 mg/m2 | Freq: Once | INTRAVENOUS | Status: AC
  Administered 2023-12-07: 130 mg via INTRAVENOUS
  Filled 2023-12-07: qty 26

## 2023-12-07 MED ORDER — LEUCOVORIN CALCIUM INJECTION 350 MG
400.0000 mg/m2 | Freq: Once | INTRAMUSCULAR | Status: AC
  Administered 2023-12-07: 620 mg via INTRAVENOUS
  Filled 2023-12-07: qty 31

## 2023-12-07 MED ORDER — DEXTROSE 5 % IV SOLN
Freq: Once | INTRAVENOUS | Status: AC
  Filled 2023-12-07: qty 250

## 2023-12-07 MED ORDER — DEXAMETHASONE SODIUM PHOSPHATE 10 MG/ML IJ SOLN
10.0000 mg | Freq: Once | INTRAMUSCULAR | Status: AC
  Administered 2023-12-07: 10 mg via INTRAVENOUS
  Filled 2023-12-07: qty 1

## 2023-12-07 MED ORDER — PALONOSETRON HCL INJECTION 0.25 MG/5ML
0.2500 mg | Freq: Once | INTRAVENOUS | Status: AC
Start: 2023-12-07 — End: 2023-12-07
  Administered 2023-12-07: 0.25 mg via INTRAVENOUS
  Filled 2023-12-07: qty 5

## 2023-12-07 MED ORDER — SODIUM CHLORIDE 0.9 % IV SOLN
2400.0000 mg/m2 | INTRAVENOUS | Status: DC
  Administered 2023-12-07: 3500 mg via INTRAVENOUS
  Filled 2023-12-07: qty 70

## 2023-12-07 NOTE — Patient Instructions (Signed)

## 2023-12-07 NOTE — Progress Notes (Signed)
 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  INTERVAL HISTORY: Patient returns to clinic today for further evaluation and consideration of cycle 11 of adjuvant FOLFOX.  She currently feels well and is asymptomatic.  She is tolerating her treatments without significant side effects.  She has a mild peripheral neuropathy, but no other neurologic complaints.  She denies any recent fevers or illnesses.  She has a good appetite and denies weight loss.  She has no chest pain, shortness of breath, cough, or hemoptysis.  She denies any nausea, vomiting, constipation, or diarrhea.  She has no melena or hematochezia.  She has no urinary complaints.  Patient offers no further specific complaints today.  REVIEW OF SYSTEMS:   Review of Systems  Constitutional: Negative.  Negative for fever, malaise/fatigue and weight loss.  Respiratory: Negative.  Negative for cough, hemoptysis and shortness of breath.   Cardiovascular: Negative.  Negative for chest pain and leg swelling.  Gastrointestinal: Negative.  Negative for abdominal pain, blood in stool and melena.  Genitourinary: Negative.  Negative for dysuria.  Musculoskeletal: Negative.  Negative for back pain.  Skin: Negative.  Negative for rash.  Neurological:  Positive for sensory change. Negative for dizziness, focal weakness, weakness and headaches.  Psychiatric/Behavioral: Negative.  The patient is not nervous/anxious.     As per HPI. Otherwise, a complete review of systems is negative.  PAST MEDICAL HISTORY: Past Medical History:  Diagnosis Date   Adenocarcinoma of colon (HCC)  04/03/2023   a.) 4 x 3 cm cecal mass identified on colonoscopy during workup for IDA. Bx 04/02/2020 --> moderately differentiated adenocarcinoma; MMR stable.   Aortic atherosclerosis (HCC)    Arteriosclerotic cardiovascular disease    Carpal tunnel syndrome    Current smoker    Hiatal hernia    Hypercholesteremia    IDA (iron  deficiency anemia)     PAST SURGICAL HISTORY: Past Surgical History:  Procedure Laterality Date   COLONOSCOPY     ESOPHAGOGASTRODUODENOSCOPY     PORTACATH PLACEMENT N/A 05/29/2023   Procedure: INSERTION PORT-A-CATH;  Surgeon: Eldred Grego, MD;  Location: [facility] ORS;  Service: General;  Laterality: N/A;    FAMILY HISTORY: Family History  Problem Relation Age of Onset   Breast cancer Mother 54   Heart disease Father    Breast cancer Maternal Aunt 18    ADVANCED DIRECTIVES (Y/N):  N  HEALTH MAINTENANCE: Social History   Tobacco Use   Smoking status: Former    Current packs/day: 0.00    Average packs/day: 1 pack/day for 38.0 years (38.0 ttl pk-yrs)    Types: Cigarettes    Quit date: 04/03/2023    Years since quitting: 0.6   Smokeless tobacco: Never  Vaping Use   Vaping status: Never Used  Substance Use Topics   Alcohol use: No    Alcohol/week: 0.0 standard drinks of alcohol   Drug use: No     Colonoscopy:  PAP:  Bone density:  Lipid panel:  Allergies  Allergen Reactions   Sulfa Antibiotics Hives and Swelling    Current Outpatient Medications  Medication Sig Dispense Refill   atorvastatin (LIPITOR) 40 MG tablet Take 40 mg by mouth at bedtime.  dexamethasone  (DECADRON ) 4 MG tablet Take 2 tablets (8 mg total) by mouth daily. Start the day after chemotherapy for 2 days. Take with food. (Patient not taking: Reported on 12/07/2023) 30 tablet 1   ferrous sulfate ER (SLOW FE) 142 (45 Fe) MG TBCR tablet Take 1 tablet by mouth 2 (two) times daily. (Patient not taking: Reported on 12/07/2023)     lidocaine -prilocaine  (EMLA ) cream Apply to  affected area once (Patient not taking: Reported on 12/07/2023) 30 g 3   Omega-3 Fatty Acids (FISH OIL) 1000 MG CAPS Take 1 capsule by mouth daily. (Patient not taking: Reported on 12/07/2023)     ondansetron  (ZOFRAN ) 4 MG tablet Take 1 tablet (4 mg total) by mouth daily as needed for nausea or vomiting. (Patient not taking: Reported on 11/23/2023) 10 tablet 1   ondansetron  (ZOFRAN ) 8 MG tablet Take 1 tablet (8 mg total) by mouth every 8 (eight) hours as needed for nausea or vomiting. Start on the third day after chemotherapy. (Patient not taking: Reported on 12/07/2023) 30 tablet 1   oxyCODONE -acetaminophen  (PERCOCET) 5-325 MG tablet Take 1 tablet by mouth every 4 (four) hours as needed for severe pain. (Patient not taking: Reported on 11/23/2023) 20 tablet 0   prochlorperazine  (COMPAZINE ) 10 MG tablet Take 1 tablet (10 mg total) by mouth every 6 (six) hours as needed for nausea or vomiting. (Patient not taking: Reported on 12/07/2023) 30 tablet 1   No current facility-administered medications for this visit.    OBJECTIVE: Vitals:   12/07/23 0836  BP: 114/61  Pulse: 74  Resp: 16  Temp: (!) 96.8 F (36 C)  SpO2: 99%     Body mass index is 18.58 kg/m.    ECOG FS:0 - Asymptomatic  General: Well-developed, well-nourished, no acute distress. Eyes: Pink conjunctiva, anicteric sclera. HEENT: Normocephalic, moist mucous membranes. Lungs: No audible wheezing or coughing. Heart: Regular rate and rhythm. Abdomen: Soft, nontender, no obvious distention. Musculoskeletal: No edema, cyanosis, or clubbing. Neuro: Alert, answering all questions appropriately. Cranial nerves grossly intact. Skin: No rashes or petechiae noted. Psych: Normal affect.  LAB RESULTS:  Lab Results  Component Value Date   NA 138 12/07/2023   K 3.4 (L) 12/07/2023   CL 106 12/07/2023   CO2 25 12/07/2023   GLUCOSE 118 (H) 12/07/2023   BUN 16 12/07/2023   CREATININE 0.53 12/07/2023   CALCIUM  8.8 (L) 12/07/2023   PROT 7.0  12/07/2023   ALBUMIN  3.7 12/07/2023   AST 36 12/07/2023   ALT 38 12/07/2023   ALKPHOS 115 12/07/2023   BILITOT 1.3 (H) 12/07/2023   GFRNONAA >60 12/07/2023    Lab Results  Component Value Date   WBC 3.8 (L) 12/07/2023   NEUTROABS 2.0 12/07/2023   HGB 11.1 (L) 12/07/2023   HCT 32.9 (L) 12/07/2023   MCV 96.8 12/07/2023   PLT 101 (L) 12/07/2023     STUDIES: MM 3D SCREENING MAMMOGRAM BILATERAL BREAST Result Date: 11/12/2023 CLINICAL DATA:  Screening. EXAM: DIGITAL SCREENING BILATERAL MAMMOGRAM WITH TOMOSYNTHESIS AND CAD TECHNIQUE: Bilateral screening digital craniocaudal and mediolateral oblique mammograms were obtained. Bilateral screening digital breast tomosynthesis was performed. The images were evaluated with computer-aided detection. COMPARISON:  Previous exam(s). ACR Breast Density Category b: There are scattered areas of fibroglandular density. FINDINGS: There are no findings suspicious for malignancy. IMPRESSION: No mammographic evidence of malignancy. A result letter of this screening mammogram will be mailed directly to the patient. RECOMMENDATION: Screening mammogram in one year. (Code:SM-B-01Y) BI-RADS CATEGORY  1: Negative. Electronically Signed  By: Sundra Engel M.D.   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  MRI of the liver on May 06, 2023 showed a 2.4 cm lesion in the liver which biopsy confirmed an isolated metastatic lesion.  Consultation at [facility] recommended adjuvant chemotherapy with FOLFOX for 65-months followed by metastectomy of liver lesion.  CEA is within normal limits at 2.8.  CT scan on August 09, 2023 as well as imaging from [facility] on August 23, 2023 revealed decreased size of known liver lesion with no  other evidence of disease.  Patient underwent partial hepatectomy on September 29, 2023 which revealed complete pathologic response.  Recommendation is 3 additional months of FOLFOX.  Proceed with cycle 11 of 12 of treatment today.  Return to clinic in 2 days for pump removal and then in 2 weeks for further evaluation and consideration of her 12th and final treatment.  Patient has follow-up with [facility] with image on January 25, 2024.  Iron  deficiency anemia: Chronic and unchanged.  Patient's hemoglobin is 11.4 today. Her most recent iron  stores on May 18, 2023 were reported as normal.  Patient last received IV Venofer  on May 04, 2023. Thrombocytopenia: Platelets in the climb to 101.  Proceed with treatment as above.  Hyponatremia: Patient's sodium level has declined slightly to 132.  Monitor. Hypokalemia: Chronic and changed.  Continue dietary recommendations. Hyperbilirubinemia: Mildly proved, monitor. Peripheral neuropathy: Mild, monitor.   Patient expressed understanding and was in agreement with this plan. She also understands that She can call clinic at any time with any questions, concerns, or complaints.    Cancer Staging  Colon adenocarcinoma [facility]) Staging form: Colon and Rectum, AJCC 8th Edition - Pathologic: pT3, pN2b, pM1 - Signed by Agrawal, Kavita, MD on 05/18/2023 Stage prefix: Initial diagnosis Total positive nodes: 7 Total nodes examined: 60 Histologic grading system: 4 grade system Histologic grade (G): G2   Shellie Dials, MD   12/07/2023 9:18 AM

## 2023-12-07 NOTE — Patient Instructions (Signed)
 [facility] - A DEPT OF [facility]  Discharge Instructions: Thank you for choosing [facility] to provide your oncology and hematology care.  If you have a lab appointment with the Cancer Center, please go directly to the Cancer Center and check in at the registration area.  Wear comfortable clothing and clothing appropriate for easy access to any Portacath or PICC line.   We strive to give you quality time with your provider. You may need to reschedule your appointment if you arrive late (15 or more minutes).  Arriving late affects you and other patients whose appointments are after yours.  Also, if you miss three or more appointments without notifying the office, you may be dismissed from the clinic at the provider's discretion.      For prescription refill requests, have your pharmacy contact our office and allow 72 hours for refills to be completed.    Today you received the following chemotherapy and/or immunotherapy agents FOLFOX      To help prevent nausea and vomiting after your treatment, we encourage you to take your nausea medication as directed.  BELOW ARE SYMPTOMS THAT SHOULD BE REPORTED IMMEDIATELY: *FEVER GREATER THAN 100.4 F (38 C) OR HIGHER *CHILLS OR SWEATING *NAUSEA AND VOMITING THAT IS NOT CONTROLLED WITH YOUR NAUSEA MEDICATION *UNUSUAL SHORTNESS OF BREATH *UNUSUAL BRUISING OR BLEEDING *URINARY PROBLEMS (pain or burning when urinating, or frequent urination) *BOWEL PROBLEMS (unusual diarrhea, constipation, pain near the anus) TENDERNESS IN MOUTH AND THROAT WITH OR WITHOUT PRESENCE OF ULCERS (sore throat, sores in mouth, or a toothache) UNUSUAL RASH, SWELLING OR PAIN  UNUSUAL VAGINAL DISCHARGE OR ITCHING   Items with * indicate a potential emergency and should be followed up as soon as possible or go to the Emergency Department if any problems should occur.  Please show the CHEMOTHERAPY ALERT CARD or IMMUNOTHERAPY ALERT  CARD at check-in to the Emergency Department and triage nurse.  Should you have questions after your visit or need to cancel or reschedule your appointment, please contact [facility] - A DEPT OF [facility]  [phone] and follow the prompts.  Office hours are 8:00 a.m. to 4:30 p.m. Monday - Friday. Please note that voicemails left after 4:00 p.m. may not be returned until the following business day.  We are closed weekends and major holidays. You have access to a nurse at all times for urgent questions. Please call the main number to the clinic [phone] and follow the prompts.  For any non-urgent questions, you may also contact your provider using MyChart. We now offer e-Visits for anyone 60 and older to request care online for non-urgent symptoms. For details visit [email or web address].   Also download the MyChart app! Go to the app store, search "MyChart", open the app, select [place], and log in with your MyChart username and password.

## 2023-12-09 ENCOUNTER — Inpatient Hospital Stay

## 2023-12-09 VITALS — BP 139/71 | HR 86 | Temp 99.4°F | Resp 16

## 2023-12-09 DIAGNOSIS — C189 Malignant neoplasm of colon, unspecified: Secondary | ICD-10-CM

## 2023-12-09 DIAGNOSIS — Z5111 Encounter for antineoplastic chemotherapy: Secondary | ICD-10-CM | POA: Diagnosis not present

## 2023-12-09 MED ORDER — SODIUM CHLORIDE 0.9% FLUSH
10.0000 mL | INTRAVENOUS | Status: DC | PRN
  Administered 2023-12-09: 10 mL
  Filled 2023-12-09: qty 10

## 2023-12-09 MED ORDER — HEPARIN SOD (PORK) LOCK FLUSH 100 UNIT/ML IV SOLN
500.0000 [IU] | Freq: Once | INTRAVENOUS | Status: AC | PRN
  Administered 2023-12-09: 500 [IU]
  Filled 2023-12-09: qty 5

## 2023-12-16 ENCOUNTER — Encounter: Payer: Self-pay | Admitting: Oncology

## 2023-12-21 ENCOUNTER — Other Ambulatory Visit: Payer: Self-pay | Admitting: Oncology

## 2023-12-21 ENCOUNTER — Inpatient Hospital Stay (HOSPITAL_BASED_OUTPATIENT_CLINIC_OR_DEPARTMENT_OTHER): Admitting: Oncology

## 2023-12-21 ENCOUNTER — Encounter: Payer: Self-pay | Admitting: Oncology

## 2023-12-21 ENCOUNTER — Inpatient Hospital Stay: Attending: Internal Medicine

## 2023-12-21 ENCOUNTER — Inpatient Hospital Stay

## 2023-12-21 VITALS — BP 150/78 | HR 75 | Resp 18

## 2023-12-21 VITALS — BP 129/88 | HR 81 | Temp 97.9°F | Resp 16 | Ht 65.5 in | Wt 115.1 lb

## 2023-12-21 DIAGNOSIS — L237 Allergic contact dermatitis due to plants, except food: Secondary | ICD-10-CM | POA: Diagnosis not present

## 2023-12-21 DIAGNOSIS — Z87891 Personal history of nicotine dependence: Secondary | ICD-10-CM | POA: Diagnosis not present

## 2023-12-21 DIAGNOSIS — E871 Hypo-osmolality and hyponatremia: Secondary | ICD-10-CM | POA: Insufficient documentation

## 2023-12-21 DIAGNOSIS — D696 Thrombocytopenia, unspecified: Secondary | ICD-10-CM | POA: Insufficient documentation

## 2023-12-21 DIAGNOSIS — C787 Secondary malignant neoplasm of liver and intrahepatic bile duct: Secondary | ICD-10-CM | POA: Diagnosis not present

## 2023-12-21 DIAGNOSIS — C189 Malignant neoplasm of colon, unspecified: Secondary | ICD-10-CM

## 2023-12-21 DIAGNOSIS — Z803 Family history of malignant neoplasm of breast: Secondary | ICD-10-CM | POA: Insufficient documentation

## 2023-12-21 DIAGNOSIS — Z5111 Encounter for antineoplastic chemotherapy: Secondary | ICD-10-CM | POA: Insufficient documentation

## 2023-12-21 DIAGNOSIS — G629 Polyneuropathy, unspecified: Secondary | ICD-10-CM | POA: Insufficient documentation

## 2023-12-21 DIAGNOSIS — D509 Iron deficiency anemia, unspecified: Secondary | ICD-10-CM | POA: Diagnosis not present

## 2023-12-21 LAB — CMP (CANCER CENTER ONLY)
ALT: 28 U/L (ref 0–44)
AST: 30 U/L (ref 15–41)
Albumin: 3.6 g/dL (ref 3.5–5.0)
Alkaline Phosphatase: 115 U/L (ref 38–126)
Anion gap: 8 (ref 5–15)
BUN: 16 mg/dL (ref 8–23)
CO2: 24 mmol/L (ref 22–32)
Calcium: 8.5 mg/dL — ABNORMAL LOW (ref 8.9–10.3)
Chloride: 102 mmol/L (ref 98–111)
Creatinine: 0.69 mg/dL (ref 0.44–1.00)
GFR, Estimated: >60 mL/min (ref >60)
Glucose, Bld: 111 mg/dL — ABNORMAL HIGH (ref 70–99)
Potassium: 3.5 mmol/L (ref 3.5–5.1)
Sodium: 134 mmol/L — ABNORMAL LOW (ref 135–145)
Total Bilirubin: 1.4 mg/dL — ABNORMAL HIGH (ref 0.0–1.2)
Total Protein: 7.3 g/dL (ref 6.5–8.1)

## 2023-12-21 LAB — CBC WITH DIFFERENTIAL (CANCER CENTER ONLY)
Abs Immature Granulocytes: 0.01 10*3/uL (ref 0.00–0.07)
Basophils Absolute: 0 10*3/uL (ref 0.0–0.1)
Basophils Relative: 1 %
Eosinophils Absolute: 0.1 10*3/uL (ref 0.0–0.5)
Eosinophils Relative: 3 %
HCT: 32.6 % — ABNORMAL LOW (ref 36.0–46.0)
Hemoglobin: 11.2 g/dL — ABNORMAL LOW (ref 12.0–15.0)
Immature Granulocytes: 0 %
Lymphocytes Relative: 23 %
Lymphs Abs: 1 10*3/uL (ref 0.7–4.0)
MCH: 33.2 pg (ref 26.0–34.0)
MCHC: 34.4 g/dL (ref 30.0–36.0)
MCV: 96.7 fL (ref 80.0–100.0)
Monocytes Absolute: 0.7 10*3/uL (ref 0.1–1.0)
Monocytes Relative: 16 %
Neutro Abs: 2.4 10*3/uL (ref 1.7–7.7)
Neutrophils Relative %: 57 %
Platelet Count: 107 10*3/uL — ABNORMAL LOW (ref 150–400)
RBC: 3.37 MIL/uL — ABNORMAL LOW (ref 3.87–5.11)
RDW: 15.4 % (ref 11.5–15.5)
WBC Count: 4.3 10*3/uL (ref 4.0–10.5)
nRBC: 0 % (ref 0.0–0.2)

## 2023-12-21 MED ORDER — LEUCOVORIN CALCIUM INJECTION 350 MG
400.0000 mg/m2 | Freq: Once | INTRAMUSCULAR | Status: AC
  Administered 2023-12-21: 620 mg via INTRAVENOUS
  Filled 2023-12-21: qty 25

## 2023-12-21 MED ORDER — DEXTROSE 5 % IV SOLN
Freq: Once | INTRAVENOUS | Status: AC
  Filled 2023-12-21: qty 250

## 2023-12-21 MED ORDER — DEXAMETHASONE SODIUM PHOSPHATE 10 MG/ML IJ SOLN
10.0000 mg | Freq: Once | INTRAMUSCULAR | Status: AC
Start: 2023-12-21 — End: 2023-12-21
  Administered 2023-12-21: 10 mg via INTRAVENOUS
  Filled 2023-12-21: qty 1

## 2023-12-21 MED ORDER — PALONOSETRON HCL INJECTION 0.25 MG/5ML
0.2500 mg | Freq: Once | INTRAVENOUS | Status: AC
  Administered 2023-12-21: 0.25 mg via INTRAVENOUS
  Filled 2023-12-21: qty 5

## 2023-12-21 MED ORDER — PREDNISONE 10 MG (21) PO TBPK: ORAL_TABLET | ORAL | 0 refills | Status: AC

## 2023-12-21 MED ORDER — DEXTROSE 5 % IV SOLN
85.0000 mg/m2 | Freq: Once | INTRAVENOUS | Status: AC
  Administered 2023-12-21: 130 mg via INTRAVENOUS
  Filled 2023-12-21: qty 26

## 2023-12-21 MED ORDER — SODIUM CHLORIDE 0.9 % IV SOLN
2400.0000 mg/m2 | INTRAVENOUS | Status: DC
  Administered 2023-12-21: 3500 mg via INTRAVENOUS
  Filled 2023-12-21: qty 70

## 2023-12-21 MED ORDER — FLUOROURACIL CHEMO INJECTION 2.5 GM/50ML
400.0000 mg/m2 | Freq: Once | INTRAVENOUS | Status: AC
  Administered 2023-12-21: 600 mg via INTRAVENOUS
  Filled 2023-12-21: qty 12

## 2023-12-21 NOTE — Patient Instructions (Signed)
 [facility] - A DEPT OF [facility]  Discharge Instructions: Thank you for choosing [facility] to provide your oncology and hematology care.  If you have a lab appointment with the Cancer Center, please go directly to the Cancer Center and check in at the registration area.  Wear comfortable clothing and clothing appropriate for easy access to any Portacath or PICC line.   We strive to give you quality time with your provider. You may need to reschedule your appointment if you arrive late (15 or more minutes).  Arriving late affects you and other patients whose appointments are after yours.  Also, if you miss three or more appointments without notifying the office, you may be dismissed from the clinic at the provider's discretion.      For prescription refill requests, have your pharmacy contact our office and allow 72 hours for refills to be completed.    Today you received the following chemotherapy and/or immunotherapy agents Oxaliplatin, Leucovorin and Adrucil       To help prevent nausea and vomiting after your treatment, we encourage you to take your nausea medication as directed.  BELOW ARE SYMPTOMS THAT SHOULD BE REPORTED IMMEDIATELY: *FEVER GREATER THAN 100.4 F (38 C) OR HIGHER *CHILLS OR SWEATING *NAUSEA AND VOMITING THAT IS NOT CONTROLLED WITH YOUR NAUSEA MEDICATION *UNUSUAL SHORTNESS OF BREATH *UNUSUAL BRUISING OR BLEEDING *URINARY PROBLEMS (pain or burning when urinating, or frequent urination) *BOWEL PROBLEMS (unusual diarrhea, constipation, pain near the anus) TENDERNESS IN MOUTH AND THROAT WITH OR WITHOUT PRESENCE OF ULCERS (sore throat, sores in mouth, or a toothache) UNUSUAL RASH, SWELLING OR PAIN  UNUSUAL VAGINAL DISCHARGE OR ITCHING   Items with * indicate a potential emergency and should be followed up as soon as possible or go to the Emergency Department if any problems should occur.  Please show the CHEMOTHERAPY  ALERT CARD or IMMUNOTHERAPY ALERT CARD at check-in to the Emergency Department and triage nurse.  Should you have questions after your visit or need to cancel or reschedule your appointment, please contact [facility] - A DEPT OF [facility]  [phone] and follow the prompts.  Office hours are 8:00 a.m. to 4:30 p.m. Monday - Friday. Please note that voicemails left after 4:00 p.m. may not be returned until the following business day.  We are closed weekends and major holidays. You have access to a nurse at all times for urgent questions. Please call the main number to the clinic [phone] and follow the prompts.  For any non-urgent questions, you may also contact your provider using MyChart. We now offer e-Visits for anyone 50 and older to request care online for non-urgent symptoms. For details visit [email or web address].   Also download the MyChart app! Go to the app store, search "MyChart", open the app, select [place], and log in with your MyChart username and password.

## 2023-12-21 NOTE — Progress Notes (Signed)
 [facility]  [phone] [phone]) [phone]  ID: Shari Melendez OB: Feb 14, 1961  MR#: [id number]  QIO#:[id number]  Patient Care Team: Arno Lapidus, MD as PCP - General (Family Medicine) Rochell Chroman, RN as Oncology Nurse Navigator Adrian Alba, Deadra Everts, MD as Consulting Physician (Oncology)  CHIEF COMPLAINT: Stage IV adenocarcinoma of the colon with isolated liver metastasis.  INTERVAL HISTORY: Patient returns to clinic today for further evaluation and consideration of her final cycle of adjuvant FOLFOX.  She has poison oak rash on her right hand from gardening this weekend, but otherwise feels well.  She continues to tolerate her treatments without significant side effects.  She has a mild peripheral neuropathy, but no other neurologic complaints.  She denies any recent fevers or illnesses.  She has a good appetite and denies weight loss.  She has no chest pain, shortness of breath, cough, or hemoptysis.  She denies any nausea, vomiting, constipation, or diarrhea.  She has no melena or hematochezia.  She has no urinary complaints.  Patient offers no further specific complaints today.  REVIEW OF SYSTEMS:   Review of Systems  Constitutional: Negative.  Negative for fever, malaise/fatigue and weight loss.  Respiratory: Negative.  Negative for cough, hemoptysis and shortness of breath.   Cardiovascular: Negative.  Negative for chest pain and leg swelling.  Gastrointestinal: Negative.  Negative for abdominal pain, blood in stool and melena.  Genitourinary: Negative.  Negative for dysuria.  Musculoskeletal: Negative.  Negative for back pain.  Skin:  Positive for rash.  Neurological:  Positive for sensory change. Negative for dizziness, focal weakness, weakness and headaches.  Psychiatric/Behavioral: Negative.  The patient is not nervous/anxious.     As per HPI. Otherwise, a complete review of systems is negative.  PAST MEDICAL HISTORY: Past Medical  History:  Diagnosis Date   Adenocarcinoma of colon (HCC) 04/03/2023   a.) 4 x 3 cm cecal mass identified on colonoscopy during workup for IDA. Bx 04/02/2020 --> moderately differentiated adenocarcinoma; MMR stable.   Aortic atherosclerosis (HCC)    Arteriosclerotic cardiovascular disease    Carpal tunnel syndrome    Current smoker    Hiatal hernia    Hypercholesteremia    IDA (iron  deficiency anemia)     PAST SURGICAL HISTORY: Past Surgical History:  Procedure Laterality Date   COLONOSCOPY     ESOPHAGOGASTRODUODENOSCOPY     PORTACATH PLACEMENT N/A 05/29/2023   Procedure: INSERTION PORT-A-CATH;  Surgeon: Eldred Grego, MD;  Location: [facility] ORS;  Service: General;  Laterality: N/A;    FAMILY HISTORY: Family History  Problem Relation Age of Onset   Breast cancer Mother 73   Heart disease Father    Breast cancer Maternal Aunt 25    ADVANCED DIRECTIVES (Y/N):  N  HEALTH MAINTENANCE: Social History   Tobacco Use   Smoking status: Former    Current packs/day: 0.00    Average packs/day: 1 pack/day for 38.0 years (38.0 ttl pk-yrs)    Types: Cigarettes    Quit date: 04/03/2023    Years since quitting: 0.7   Smokeless tobacco: Never  Vaping Use   Vaping status: Never Used  Substance Use Topics   Alcohol use: No    Alcohol/week: 0.0 standard drinks of alcohol   Drug use: No     Colonoscopy:  PAP:  Bone density:  Lipid panel:  Allergies  Allergen Reactions   Sulfa Antibiotics Hives and Swelling    Current Outpatient Medications  Medication Sig Dispense Refill   atorvastatin (LIPITOR) 40  MG tablet Take 40 mg by mouth at bedtime.     predniSONE (STERAPRED UNI-PAK 21 TAB) 10 MG (21) TBPK tablet Taper as directed 21 tablet 0   dexamethasone  (DECADRON ) 4 MG tablet Take 2 tablets (8 mg total) by mouth daily. Start the day after chemotherapy for 2 days. Take with food. (Patient not taking: Reported on 11/23/2023) 30 tablet 1   ferrous sulfate ER (SLOW FE) 142 (45 Fe)  MG TBCR tablet Take 1 tablet by mouth 2 (two) times daily. (Patient not taking: Reported on 11/23/2023)     lidocaine -prilocaine  (EMLA ) cream Apply to affected area once (Patient not taking: Reported on 11/23/2023) 30 g 3   Omega-3 Fatty Acids (FISH OIL) 1000 MG CAPS Take 1 capsule by mouth daily. (Patient not taking: Reported on 11/23/2023)     ondansetron  (ZOFRAN ) 4 MG tablet Take 1 tablet (4 mg total) by mouth daily as needed for nausea or vomiting. (Patient not taking: Reported on 11/23/2023) 10 tablet 1   ondansetron  (ZOFRAN ) 8 MG tablet Take 1 tablet (8 mg total) by mouth every 8 (eight) hours as needed for nausea or vomiting. Start on the third day after chemotherapy. (Patient not taking: Reported on 11/23/2023) 30 tablet 1   oxyCODONE -acetaminophen  (PERCOCET) 5-325 MG tablet Take 1 tablet by mouth every 4 (four) hours as needed for severe pain. (Patient not taking: Reported on 11/23/2023) 20 tablet 0   prochlorperazine  (COMPAZINE ) 10 MG tablet Take 1 tablet (10 mg total) by mouth every 6 (six) hours as needed for nausea or vomiting. (Patient not taking: Reported on 11/23/2023) 30 tablet 1   No current facility-administered medications for this visit.   Facility-Administered Medications Ordered in Other Visits  Medication Dose Route Frequency Provider Last Rate Last Admin   dexamethasone  (DECADRON ) injection 10 mg  10 mg Intravenous Once Maresha Anastos J, MD       fluorouracil  (ADRUCIL ) 3,500 mg in sodium chloride  0.9 % 80 mL chemo infusion  2,400 mg/m2 (Treatment Plan Recorded) Intravenous 1 day or 1 dose Danyal Whitenack J, MD       fluorouracil  (ADRUCIL ) chemo injection 600 mg  400 mg/m2 (Treatment Plan Recorded) Intravenous Once Jacobb Alen J, MD       leucovorin  620 mg in dextrose  5 % 250 mL infusion  400 mg/m2 (Treatment Plan Recorded) Intravenous Once Mystery Schrupp J, MD       oxaliplatin  (ELOXATIN ) 130 mg in dextrose  5 % 500 mL chemo infusion  85 mg/m2 (Treatment Plan Recorded)  Intravenous Once Gerik Coberly J, MD       palonosetron  (ALOXI ) injection 0.25 mg  0.25 mg Intravenous Once Shellie Dials, MD        OBJECTIVE: Vitals:   12/21/23 0844  BP: 129/88  Pulse: 81  Resp: 16  Temp: 97.9 F (36.6 C)  SpO2: 96%      Body mass index is 18.86 kg/m.    ECOG FS:0 - Asymptomatic  General: Well-developed, well-nourished, no acute distress. Eyes: Pink conjunctiva, anicteric sclera. HEENT: Normocephalic, moist mucous membranes. Lungs: No audible wheezing or coughing. Heart: Regular rate and rhythm. Abdomen: Soft, nontender, no obvious distention. Musculoskeletal: No edema, cyanosis, or clubbing. Neuro: Alert, answering all questions appropriately. Cranial nerves grossly intact. Skin: Rash on the right hand consistent with poison oak. Psych: Normal affect.  LAB RESULTS:  Lab Results  Component Value Date   NA 134 (L) 12/21/2023   K 3.5 12/21/2023   CL 102 12/21/2023   CO2 24 12/21/2023  GLUCOSE 111 (H) 12/21/2023   BUN 16 12/21/2023   CREATININE 0.69 12/21/2023   CALCIUM  8.5 (L) 12/21/2023   PROT 7.3 12/21/2023   ALBUMIN  3.6 12/21/2023   AST 30 12/21/2023   ALT 28 12/21/2023   ALKPHOS 115 12/21/2023   BILITOT 1.4 (H) 12/21/2023   GFRNONAA >60 12/21/2023    Lab Results  Component Value Date   WBC 4.3 12/21/2023   NEUTROABS 2.4 12/21/2023   HGB 11.2 (L) 12/21/2023   HCT 32.6 (L) 12/21/2023   MCV 96.7 12/21/2023   PLT 107 (L) 12/21/2023     STUDIES: No results found.    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  MRI of the liver on May 06, 2023 showed a 2.4 cm lesion in the liver which biopsy confirmed an isolated metastatic lesion.  Consultation at [facility] recommended adjuvant chemotherapy with FOLFOX for  49-months followed by metastectomy of liver lesion.  CEA is within normal limits at 2.8.  CT scan on August 09, 2023 as well as imaging from [facility] on August 23, 2023 revealed decreased size of known liver lesion with no other evidence of disease.  Patient underwent partial hepatectomy on September 29, 2023 which revealed complete pathologic response.  Recommendation is 3 additional months of FOLFOX.  Proceed with cycle 12 of 12 of treatment today.  Return to clinic in 2 days for pump removal.  Patient then has follow-up with [facility] with laboratory work and imaging on January 25, 2024.  Return to clinic 1 week later on February 01, 2024 for further evaluation. Iron  deficiency anemia: Mild.  Patient's hemoglobin is 11.2 today.  Her most recent iron  stores on May 18, 2023 were reported as normal.  Patient last received IV Venofer  on May 04, 2023. Thrombocytopenia: Chronic and unchanged.  Patient's platelet count is 107 today.  Proceed with treatment as above.  Hyponatremia: Mild, monitor. Hypokalemia: Resolved. Hyperbilirubinemia: Chronic and unchanged.  Patient's bilirubin is 1.4 today.   Peripheral neuropathy: Mild, monitor. Poison oak rash: Patient has been instructed to continue topical calamine lotion and was given a prescription for Medrol Dosepak.   Patient expressed understanding and was in agreement with this plan. She also understands that She can call clinic at any time with any questions, concerns, or complaints.    Cancer Staging  Colon adenocarcinoma [facility]) Staging form: Colon and Rectum, AJCC 8th Edition - Pathologic: pT3, pN2b, pM1 - Signed by Agrawal, Kavita, MD on 05/18/2023 Stage prefix: Initial diagnosis Total positive nodes: 7 Total nodes examined: 60 Histologic grading system: 4 grade system Histologic grade (G): G2   Shellie Dials, MD   12/21/2023 9:17 AM

## 2023-12-22 ENCOUNTER — Other Ambulatory Visit: Payer: Self-pay

## 2023-12-23 ENCOUNTER — Inpatient Hospital Stay

## 2023-12-23 DIAGNOSIS — Z5111 Encounter for antineoplastic chemotherapy: Secondary | ICD-10-CM | POA: Diagnosis not present

## 2023-12-23 DIAGNOSIS — C189 Malignant neoplasm of colon, unspecified: Secondary | ICD-10-CM

## 2023-12-23 MED ORDER — SODIUM CHLORIDE 0.9% FLUSH
10.0000 mL | INTRAVENOUS | Status: DC | PRN
  Administered 2023-12-23: 10 mL
  Filled 2023-12-23: qty 10

## 2023-12-23 MED ORDER — HEPARIN SOD (PORK) LOCK FLUSH 100 UNIT/ML IV SOLN
500.0000 [IU] | Freq: Once | INTRAVENOUS | Status: AC | PRN
  Administered 2023-12-23: 500 [IU]
  Filled 2023-12-23: qty 5

## 2024-01-04 ENCOUNTER — Ambulatory Visit

## 2024-01-04 ENCOUNTER — Ambulatory Visit: Admitting: Oncology

## 2024-01-04 ENCOUNTER — Other Ambulatory Visit

## 2024-01-06 ENCOUNTER — Encounter

## 2024-02-01 ENCOUNTER — Other Ambulatory Visit: Payer: Self-pay | Admitting: *Deleted

## 2024-02-01 ENCOUNTER — Inpatient Hospital Stay: Attending: Internal Medicine | Admitting: Oncology

## 2024-02-01 ENCOUNTER — Encounter: Payer: Self-pay | Admitting: Oncology

## 2024-02-01 VITALS — BP 118/73 | HR 73 | Temp 96.8°F | Resp 16 | Ht 65.5 in | Wt 115.8 lb

## 2024-02-01 DIAGNOSIS — C189 Malignant neoplasm of colon, unspecified: Secondary | ICD-10-CM | POA: Diagnosis not present

## 2024-02-01 DIAGNOSIS — Z87891 Personal history of nicotine dependence: Secondary | ICD-10-CM | POA: Insufficient documentation

## 2024-02-01 DIAGNOSIS — Z803 Family history of malignant neoplasm of breast: Secondary | ICD-10-CM | POA: Diagnosis not present

## 2024-02-01 DIAGNOSIS — C180 Malignant neoplasm of cecum: Secondary | ICD-10-CM | POA: Diagnosis present

## 2024-02-01 DIAGNOSIS — C787 Secondary malignant neoplasm of liver and intrahepatic bile duct: Secondary | ICD-10-CM | POA: Diagnosis not present

## 2024-02-01 DIAGNOSIS — D509 Iron deficiency anemia, unspecified: Secondary | ICD-10-CM | POA: Insufficient documentation

## 2024-02-01 DIAGNOSIS — G629 Polyneuropathy, unspecified: Secondary | ICD-10-CM | POA: Diagnosis not present

## 2024-02-01 NOTE — Progress Notes (Signed)
 [facility]  [phone] Fax:(336) [phone]  ID: Helayne Lo OB: [birth year]  MR#: [id number]  RJJ#:[id number]  Patient Care Team: Arno Lapidus, MD as PCP - General (Family Medicine) Rochell Chroman, RN as Oncology Nurse Navigator Adrian Alba, Deadra Everts, MD as Consulting Physician (Oncology)  CHIEF COMPLAINT: Stage IV adenocarcinoma of the colon with isolated liver metastasis.  INTERVAL HISTORY: Patient returns to clinic today for further evaluation and discussion of her CT scan results which were completed at [facility].  She continues to have a mild and chronic peripheral neuropathy, but otherwise feels well.  She has no other neurologic complaints.  She does not complain of any weakness or fatigue today.  She denies any recent fevers or illnesses.  She has a good appetite and denies weight loss.  She has no chest pain, shortness of breath, cough, or hemoptysis.  She denies any nausea, vomiting, constipation, or diarrhea.  She has no melena or hematochezia.  She has no urinary complaints.  Patient offers no further specific complaints today.  REVIEW OF SYSTEMS:   Review of Systems  Constitutional: Negative.  Negative for fever, malaise/fatigue and weight loss.  Respiratory: Negative.  Negative for cough, hemoptysis and shortness of breath.   Cardiovascular: Negative.  Negative for chest pain and leg swelling.  Gastrointestinal: Negative.  Negative for abdominal pain, blood in stool and melena.  Genitourinary: Negative.  Negative for dysuria.  Musculoskeletal: Negative.  Negative for back pain.  Skin: Negative.  Negative for rash.  Neurological:  Positive for sensory change. Negative for dizziness, focal weakness, weakness and headaches.  Psychiatric/Behavioral: Negative.  The patient is not nervous/anxious.     As per HPI. Otherwise, a complete review of systems is negative.  PAST MEDICAL HISTORY: Past Medical History:  Diagnosis Date    Adenocarcinoma of colon (HCC) 04/03/2023   a.) 4 x 3 cm cecal mass identified on colonoscopy during workup for IDA. Bx 04/02/2020 --> moderately differentiated adenocarcinoma; MMR stable.   Aortic atherosclerosis (HCC)    Arteriosclerotic cardiovascular disease    Carpal tunnel syndrome    Current smoker    Hiatal hernia    Hypercholesteremia    IDA (iron  deficiency anemia)     PAST SURGICAL HISTORY: Past Surgical History:  Procedure Laterality Date   COLONOSCOPY     ESOPHAGOGASTRODUODENOSCOPY     PORTACATH PLACEMENT N/A 05/29/2023   Procedure: INSERTION PORT-A-CATH;  Surgeon: Eldred Grego, MD;  Location: [facility] ORS;  Service: General;  Laterality: N/A;    FAMILY HISTORY: Family History  Problem Relation Age of Onset   Breast cancer Mother 61   Heart disease Father    Breast cancer Maternal Aunt 9    ADVANCED DIRECTIVES (Y/N):  N  HEALTH MAINTENANCE: Social History   Tobacco Use   Smoking status: Former    Current packs/day: 0.00    Average packs/day: 1 pack/day for 38.0 years (38.0 ttl pk-yrs)    Types: Cigarettes    Quit date: 04/03/2023    Years since quitting: 0.8   Smokeless tobacco: Never  Vaping Use   Vaping status: Never Used  Substance Use Topics   Alcohol use: No    Alcohol/week: 0.0 standard drinks of alcohol   Drug use: No     Colonoscopy:  PAP:  Bone density:  Lipid panel:  Allergies  Allergen Reactions   Sulfa Antibiotics Hives and Swelling    Current Outpatient Medications  Medication Sig Dispense Refill   atorvastatin (LIPITOR) 40 MG tablet Take  40 mg by mouth at bedtime.     dexamethasone  (DECADRON ) 4 MG tablet Take 2 tablets (8 mg total) by mouth daily. Start the day after chemotherapy for 2 days. Take with food. (Patient not taking: Reported on 02/01/2024) 30 tablet 1   ferrous sulfate ER (SLOW FE) 142 (45 Fe) MG TBCR tablet Take 1 tablet by mouth 2 (two) times daily. (Patient not taking: Reported on 02/01/2024)      lidocaine -prilocaine  (EMLA ) cream Apply to affected area once (Patient not taking: Reported on 02/01/2024) 30 g 3   Omega-3 Fatty Acids (FISH OIL) 1000 MG CAPS Take 1 capsule by mouth daily. (Patient not taking: Reported on 02/01/2024)     ondansetron  (ZOFRAN ) 4 MG tablet Take 1 tablet (4 mg total) by mouth daily as needed for nausea or vomiting. (Patient not taking: Reported on 02/01/2024) 10 tablet 1   ondansetron  (ZOFRAN ) 8 MG tablet Take 1 tablet (8 mg total) by mouth every 8 (eight) hours as needed for nausea or vomiting. Start on the third day after chemotherapy. (Patient not taking: Reported on 02/01/2024) 30 tablet 1   oxyCODONE -acetaminophen  (PERCOCET) 5-325 MG tablet Take 1 tablet by mouth every 4 (four) hours as needed for severe pain. (Patient not taking: Reported on 02/01/2024) 20 tablet 0   predniSONE  (STERAPRED UNI-PAK 21 TAB) 10 MG (21) TBPK tablet Taper as directed (Patient not taking: Reported on 02/01/2024) 21 tablet 0   prochlorperazine  (COMPAZINE ) 10 MG tablet Take 1 tablet (10 mg total) by mouth every 6 (six) hours as needed for nausea or vomiting. (Patient not taking: Reported on 02/01/2024) 30 tablet 1   No current facility-administered medications for this visit.    OBJECTIVE: Vitals:   02/01/24 0946  BP: 118/73  Pulse: 73  Resp: 16  Temp: (!) 96.8 F (36 C)  SpO2: 100%      Body mass index is 18.98 kg/m.    ECOG FS:0 - Asymptomatic  General: Well-developed, well-nourished, no acute distress. Eyes: Pink conjunctiva, anicteric sclera. HEENT: Normocephalic, moist mucous membranes. Lungs: No audible wheezing or coughing. Heart: Regular rate and rhythm. Abdomen: Soft, nontender, no obvious distention. Musculoskeletal: No edema, cyanosis, or clubbing. Neuro: Alert, answering all questions appropriately. Cranial nerves grossly intact. Skin: No rashes or petechiae noted. Psych: Normal affect.  LAB RESULTS:  Lab Results  Component Value Date   NA 134 (L) 12/21/2023    K 3.5 12/21/2023   CL 102 12/21/2023   CO2 24 12/21/2023   GLUCOSE 111 (H) 12/21/2023   BUN 16 12/21/2023   CREATININE 0.69 12/21/2023   CALCIUM  8.5 (L) 12/21/2023   PROT 7.3 12/21/2023   ALBUMIN  3.6 12/21/2023   AST 30 12/21/2023   ALT 28 12/21/2023   ALKPHOS 115 12/21/2023   BILITOT 1.4 (H) 12/21/2023   GFRNONAA >60 12/21/2023    Lab Results  Component Value Date   WBC 4.3 12/21/2023   NEUTROABS 2.4 12/21/2023   HGB 11.2 (L) 12/21/2023   HCT 32.6 (L) 12/21/2023   MCV 96.7 12/21/2023   PLT 107 (L) 12/21/2023     STUDIES: No results found.  ONCOLOGY HISTORY:  Patient underwent right hemicolectomy with lymph node dissection on April 22, 2023.  MRI of the liver on May 06, 2023 showed a 2.4 cm lesion in the liver which biopsy confirmed an isolated metastatic lesion.  Consultation at [facility] recommended neoadjuvant chemotherapy with FOLFOX for 45-months followed by metastectomy of liver lesion.  CEA was within normal limits at 2.8.  CT  scan on August 09, 2023 as well as imaging from [facility] on August 23, 2023 revealed decreased size of known liver lesion with no other evidence of disease.  Patient underwent partial hepatectomy on September 29, 2023 which revealed complete pathologic response.  Recommendation was then 3 additional months of FOLFOX which patient completed on Dec 21, 2023.  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 Imaging from [facility] on January 25, 2024 was reported as no evidence of disease.  CEA also reported within normal limits.  No intervention is needed at this time.  Return to clinic in 3 months after imaging and evaluation at [facility] for further evaluation.  At which time patient can likely be transition to every 6 months imaging and evaluation at [facility]. Iron  deficiency anemia:  Chronic and unchanged.  Patient's hemoglobin is 11.1.  Her most recent iron  stores on May 18, 2023 were reported as normal.  Patient last received IV Venofer  on May 04, 2023. Thrombocytopenia: Resolved. Hyperbilirubinemia: Resolved.   Peripheral neuropathy: Chronic and unchanged. Poison oak rash: Resolved.  Patient expressed understanding and was in agreement with this plan. She also understands that She can call clinic at any time with any questions, concerns, or complaints.    Cancer Staging  Colon adenocarcinoma [facility]) Staging form: Colon and Rectum, AJCC 8th Edition - Pathologic: pT3, pN2b, pM1 - Signed by Agrawal, Kavita, MD on 05/18/2023 Stage prefix: Initial diagnosis Total positive nodes: 7 Total nodes examined: 60 Histologic grading system: 4 grade system Histologic grade (G): G2   Shellie Dials, MD   02/01/2024 4:47 PM

## 2024-02-02 ENCOUNTER — Other Ambulatory Visit: Payer: Self-pay

## 2024-04-27 ENCOUNTER — Encounter: Payer: Self-pay | Admitting: Internal Medicine

## 2024-04-27 ENCOUNTER — Ambulatory Visit
Admission: RE | Admit: 2024-04-27 | Discharge: 2024-04-27 | Disposition: A | Discharge status: Home / Self Care | Attending: Internal Medicine | Admitting: Internal Medicine

## 2024-04-27 ENCOUNTER — Ambulatory Visit: Admitting: General Practice

## 2024-04-27 ENCOUNTER — Encounter
Admission: RE | Disposition: A | Discharge status: Home / Self Care | Payer: Self-pay | Source: Home / Self Care | Attending: Internal Medicine

## 2024-04-27 DIAGNOSIS — Z85038 Personal history of other malignant neoplasm of large intestine: Secondary | ICD-10-CM | POA: Diagnosis not present

## 2024-04-27 DIAGNOSIS — Z980 Intestinal bypass and anastomosis status: Secondary | ICD-10-CM | POA: Diagnosis not present

## 2024-04-27 DIAGNOSIS — Z8505 Personal history of malignant neoplasm of liver: Secondary | ICD-10-CM | POA: Diagnosis not present

## 2024-04-27 DIAGNOSIS — Z9049 Acquired absence of other specified parts of digestive tract: Secondary | ICD-10-CM | POA: Diagnosis not present

## 2024-04-27 DIAGNOSIS — K640 First degree hemorrhoids: Secondary | ICD-10-CM | POA: Diagnosis not present

## 2024-04-27 DIAGNOSIS — K621 Rectal polyp: Secondary | ICD-10-CM | POA: Diagnosis not present

## 2024-04-27 DIAGNOSIS — K5730 Diverticulosis of large intestine without perforation or abscess without bleeding: Secondary | ICD-10-CM | POA: Diagnosis not present

## 2024-04-27 DIAGNOSIS — J449 Chronic obstructive pulmonary disease, unspecified: Secondary | ICD-10-CM | POA: Insufficient documentation

## 2024-04-27 DIAGNOSIS — Z9221 Personal history of antineoplastic chemotherapy: Secondary | ICD-10-CM | POA: Diagnosis not present

## 2024-04-27 DIAGNOSIS — Z1211 Encounter for screening for malignant neoplasm of colon: Secondary | ICD-10-CM | POA: Diagnosis present

## 2024-04-27 SURGERY — COLONOSCOPY
Anesthesia: General

## 2024-04-27 MED ORDER — SODIUM CHLORIDE 0.9 % IV SOLN
INTRAVENOUS | Status: DC
  Administered 2024-04-27: 20 mL/h via INTRAVENOUS

## 2024-04-27 MED ORDER — LIDOCAINE HCL (PF) 2 % IJ SOLN
INTRAMUSCULAR | Status: DC | PRN
  Administered 2024-04-27: 40 mg via INTRADERMAL

## 2024-04-27 MED ORDER — PROPOFOL 10 MG/ML IV BOLUS
INTRAVENOUS | Status: DC | PRN
Start: 2024-04-27 — End: 2024-04-27
  Administered 2024-04-27: 20 mg via INTRAVENOUS
  Administered 2024-04-27: 50 mg via INTRAVENOUS
  Administered 2024-04-27 (×2): 20 mg via INTRAVENOUS

## 2024-04-27 NOTE — Interval H&P Note (Signed)
 History and Physical Interval Note:  04/27/2024 11:03 AM  Shari Melendez  has presented today for surgery, with the diagnosis of Personal history of colon cancer [Z85.038] Iron  deficiency anemia, unspecified type [D50.9].  The various methods of treatment have been discussed with the patient and family. After consideration of risks, benefits and other options for treatment, the patient has consented to  Procedure(s): COLONOSCOPY (N/A) as a surgical intervention.  The patient's history has been reviewed, patient examined, no change in status, stable for surgery.  I have reviewed the patient's chart and labs.  Questions were answered to the patient's satisfaction.     [place], Avalynn Bowe

## 2024-04-27 NOTE — Transfer of Care (Signed)
 Immediate Anesthesia Transfer of Care Note  Patient: Shari Melendez  Procedure(s) Performed: COLONOSCOPY POLYPECTOMY, INTESTINE  Patient Location: PACU  Anesthesia Type:MAC  Level of Consciousness: drowsy  Airway & Oxygen Therapy: Patient Spontanous Breathing and Patient connected to nasal cannula oxygen  Post-op Assessment: Report given to RN and Post -op Vital signs reviewed and stable  Post vital signs: Reviewed and stable  Last Vitals:  Vitals Value Taken Time  BP 97/54 04/27/24 11:27  Temp    Pulse 73 04/27/24 11:28  Resp 22 04/27/24 11:28  SpO2 100 % 04/27/24 11:28  Vitals shown include unfiled device data.  Last Pain:  Vitals:   04/27/24 1019  TempSrc: Temporal  PainSc: 0-No pain         Complications: No notable events documented.

## 2024-04-27 NOTE — Anesthesia Postprocedure Evaluation (Signed)
 Anesthesia Post Note  Patient: Shari Melendez  Procedure(s) Performed: COLONOSCOPY POLYPECTOMY, INTESTINE  Patient location during evaluation: Endoscopy Anesthesia Type: General Level of consciousness: awake and alert Pain management: pain level controlled Vital Signs Assessment: post-procedure vital signs reviewed and stable Respiratory status: spontaneous breathing, nonlabored ventilation, respiratory function stable and patient connected to nasal cannula oxygen Cardiovascular status: blood pressure returned to baseline and stable Postop Assessment: no apparent nausea or vomiting Anesthetic complications: no   No notable events documented.   Last Vitals:  Vitals:   04/27/24 1128 04/27/24 1138  BP: (!) 97/54 (!) 99/50  Pulse: 73 81  Resp: 19 18  Temp: (!) 35.7 C   SpO2: 100% 98%    Last Pain:  Vitals:   04/27/24 1128  TempSrc: Temporal  PainSc:                  Debby Mines

## 2024-04-27 NOTE — Op Note (Addendum)
 [facility] Gastroenterology Patient Name: Idelle Reimann Procedure Date: 04/27/2024 10:58 AM MRN: [id number] Account #: [id number] Date of Birth: 1960/10/12 Admit Type: Outpatient Age: 63 Room: [facility] ENDO ROOM 2 Gender: Female Note Status: Supervisor Override Instrument Name: Peds Colonoscope [id number] Procedure:             Colonoscopy Indications:           Unexplained iron  deficiency anemia, Personal history                         of colon cancer Providers:             Mechel Haggard K. Abed Schar MD, MD Medicines:             Propofol  per Anesthesia Complications:         No immediate complications. Estimated blood loss:                         Minimal. Procedure:             Pre-Anesthesia Assessment:                        - The risks and benefits of the procedure and the                         sedation options and risks were discussed with the                         patient. All questions were answered and informed                         consent was obtained.                        - Patient identification and proposed procedure were                         verified prior to the procedure by the nurse. The                         procedure was verified in the procedure room.                        - ASA Grade Assessment: III - A patient with severe                         systemic disease.                        - After reviewing the risks and benefits, the patient                         was deemed in satisfactory condition to undergo the                         procedure.                        After obtaining informed consent, the colonoscope was  passed under direct vision. Throughout the procedure,                         the patient's blood pressure, pulse, and oxygen                         saturations were monitored continuously. The                         Colonoscope was introduced through the anus and                          advanced to the the ileocolonic anastomosis. The                         colonoscopy was performed without difficulty. The                         patient tolerated the procedure well. The quality of                         the bowel preparation was good. Ileocolonic                         anastomosis and neoterminal ileum were photographed. Findings:      The perianal and digital rectal examinations were normal. Pertinent       negatives include normal sphincter tone and no palpable rectal lesions.      Non-bleeding internal hemorrhoids were found during retroflexion. The       hemorrhoids were Grade I (internal hemorrhoids that do not prolapse).      A few small-mouthed diverticula were found in the sigmoid colon.      There was evidence of a prior side-to-side colo-colonic anastomosis in       the transverse colon. This was patent and was characterized by healthy       appearing mucosa. The anastomosis was traversed.      The neo-terminal ileum appeared normal.      A 4 mm polyp was found in the rectum. The polyp was sessile. The polyp       was removed with a jumbo cold forceps. Resection and retrieval were       complete. Estimated blood loss was minimal.      No other significant abnormalities were identified in a careful       examination of the remainder of the colon. Impression:            - Non-bleeding internal hemorrhoids.                        - Diverticulosis in the sigmoid colon.                        - Patent side-to-side colo-colonic anastomosis,                         characterized by healthy appearing mucosa.                        - The examined portion of the ileum was normal.                        -  One 4 mm polyp in the rectum, removed with a jumbo                         cold forceps. Resected and retrieved. Recommendation:        - Patient has a contact number available for                         emergencies. The signs and symptoms of potential                          delayed complications were discussed with the patient.                         Return to normal activities tomorrow. Written                         discharge instructions were provided to the patient.                        - Resume previous diet.                        - Continue present medications.                        - Repeat colonoscopy in 1 year for surveillance.                        - Return to GI office PRN.                        - The findings and recommendations were discussed with                         the patient. Procedure Code(s):     --- Professional ---                        [phone], Colonoscopy, flexible; with biopsy, single or                         multiple Diagnosis Code(s):     --- Professional ---                        K57.30, Diverticulosis of large intestine without                         perforation or abscess without bleeding                        D50.9, Iron  deficiency anemia, unspecified                        D12.8, Benign neoplasm of rectum                        Z98.0, Intestinal bypass and anastomosis status                        K64.0, First degree hemorrhoids CPT copyright 2022 American Medical Association.  All rights reserved. The codes documented in this report are preliminary and upon coder review may  be revised to meet current compliance requirements. Ladell MARLA Boss MD, MD 04/27/2024 11:30:24 AM This report has been signed electronically. Number of Addenda: 0 Note Initiated On: 04/27/2024 10:58 AM Scope Withdrawal Time: 0 hours 4 minutes 54 seconds  Total Procedure Duration: 0 hours 9 minutes 53 seconds  Estimated Blood Loss:  Estimated blood loss: none. Estimated blood loss was                         minimal. Estimated blood loss: none. Estimated blood                         loss: none.      [facility]

## 2024-04-27 NOTE — H&P (Signed)
 Outpatient short stay form Pre-procedure 04/27/2024 11:03 AM Shari Melendez, M.D.  Primary Physician: Heron Nett, M.D.  Reason for visit:  Hx of colon cancer  History of present illness:  Shari Melendez is a 63 y.o. female who presents to the [facility] for follow up. Pt was last seen in clinic 02/2023 for IDA.  She underwent EGD colonoscopy for evaluation of iron  deficiency anemia. During colonoscopy infiltrative mass was found in the cecum. In September 2024 she had right colectomy, pT3, pN2b, pM1. She was found to have a liver met, seen during colon surgery with biopsy taken positive for metastasis. She completed 3 months of chemotherapy. Now post S8 hepatectomy 09/29/2023 with Dr. Barbaraann.     Current Facility-Administered Medications:    0.9 %  sodium chloride  infusion, , Intravenous, Continuous, [name], [name], MD, Last Rate: 20 mL/hr at 04/27/24 1056, Continued from Pre-op at 04/27/24 1056  Medications Prior to Admission  Medication Sig Dispense Refill Last Dose/Taking   atorvastatin (LIPITOR) 40 MG tablet Take 40 mg by mouth at bedtime.      dexamethasone  (DECADRON ) 4 MG tablet Take 2 tablets (8 mg total) by mouth daily. Start the day after chemotherapy for 2 days. Take with food. (Patient not taking: Reported on 02/01/2024) 30 tablet 1    ferrous sulfate ER (SLOW FE) 142 (45 Fe) MG TBCR tablet Take 1 tablet by mouth 2 (two) times daily. (Patient not taking: Reported on 02/01/2024)      lidocaine -prilocaine  (EMLA ) cream Apply to affected area once (Patient not taking: Reported on 02/01/2024) 30 g 3    Omega-3 Fatty Acids (FISH OIL) 1000 MG CAPS Take 1 capsule by mouth daily. (Patient not taking: Reported on 02/01/2024)      ondansetron  (ZOFRAN ) 8 MG tablet Take 1 tablet (8 mg total) by mouth every 8 (eight) hours as needed for nausea or vomiting. Start on the third day after chemotherapy. (Patient not taking: Reported on 02/01/2024) 30 tablet 1    predniSONE   (STERAPRED UNI-PAK 21 TAB) 10 MG (21) TBPK tablet Taper as directed (Patient not taking: Reported on 02/01/2024) 21 tablet 0    prochlorperazine  (COMPAZINE ) 10 MG tablet Take 1 tablet (10 mg total) by mouth every 6 (six) hours as needed for nausea or vomiting. (Patient not taking: Reported on 02/01/2024) 30 tablet 1      Allergies  Allergen Reactions   Sulfa Antibiotics Hives and Swelling     Past Medical History:  Diagnosis Date   Adenocarcinoma of colon (HCC) 04/03/2023   a.) 4 x 3 cm cecal mass identified on colonoscopy during workup for IDA. Bx 04/02/2020 --> moderately differentiated adenocarcinoma; MMR stable.   Aortic atherosclerosis (HCC)    Arteriosclerotic cardiovascular disease    Carpal tunnel syndrome    Current smoker    Hiatal hernia    Hypercholesteremia    IDA (iron  deficiency anemia)     Review of systems:  Otherwise negative.    Physical Exam  Gen: Alert, oriented. Appears stated age.  HEENT: Foley/AT. PERRLA. Lungs: CTA, no wheezes. CV: RR nl S1, S2. Abd: soft, benign, no masses. BS+ Ext: No edema. Pulses 2+    Planned procedures: Proceed with colonoscopy. The patient understands the nature of the planned procedure, indications, risks, alternatives and potential complications including but not limited to bleeding, infection, perforation, damage to internal organs and possible oversedation/side effects from anesthesia. The patient agrees and gives consent to proceed.  Please refer to procedure notes for findings,  recommendations and patient disposition/instructions.     [name], M.D. Gastroenterology 04/27/2024  11:03 AM

## 2024-04-27 NOTE — Anesthesia Preprocedure Evaluation (Signed)
 Anesthesia Evaluation  Patient identified by MRN, date of birth, ID band Patient awake    Reviewed: Allergy & Precautions, NPO status , Patient's Chart, lab work & pertinent test results  Airway Mallampati: II  TM Distance: >3 FB Neck ROM: Full    Dental  (+) Teeth Intact   Pulmonary neg pulmonary ROS, COPD, Patient abstained from smoking., former smoker   Pulmonary exam normal breath sounds clear to auscultation       Cardiovascular Exercise Tolerance: Good negative cardio ROS Normal cardiovascular exam Rhythm:Regular Rate:Normal     Neuro/Psych negative neurological ROS  negative psych ROS   GI/Hepatic negative GI ROS, Neg liver ROS, hiatal hernia,,,  Endo/Other  negative endocrine ROS    Renal/GU      Musculoskeletal   Abdominal   Peds  Hematology negative hematology ROS (+) Blood dyscrasia, anemia   Anesthesia Other Findings Past Medical History: 04/03/2023: Adenocarcinoma of colon (HCC)     Comment:  a.) 4 x 3 cm cecal mass identified on colonoscopy during              workup for IDA. Bx 04/02/2020 --> moderately               differentiated adenocarcinoma; MMR stable. No date: Aortic atherosclerosis (HCC) No date: Arteriosclerotic cardiovascular disease No date: Carpal tunnel syndrome No date: Current smoker No date: Hiatal hernia No date: Hypercholesteremia No date: IDA (iron  deficiency anemia)  Past Surgical History: No date: COLONOSCOPY No date: ESOPHAGOGASTRODUODENOSCOPY     Reproductive/Obstetrics negative OB ROS                              Anesthesia Physical Anesthesia Plan  ASA: 2  Anesthesia Plan: General   Post-op Pain Management: Minimal or no pain anticipated   Induction: Intravenous  PONV Risk Score and Plan: 2 and Propofol  infusion and TIVA  Airway Management Planned: Nasal Cannula  Additional Equipment: None  Intra-op Plan:    Post-operative Plan:   Informed Consent: I have reviewed the patients History and Physical, chart, labs and discussed the procedure including the risks, benefits and alternatives for the proposed anesthesia with the patient or authorized representative who has indicated his/her understanding and acceptance.     Dental advisory given  Plan Discussed with: CRNA and Surgeon  Anesthesia Plan Comments: (Discussed risks of anesthesia with patient, including possibility of difficulty with spontaneous ventilation under anesthesia necessitating airway intervention, PONV, and rare risks such as cardiac or respiratory or neurological events, and allergic reactions. Discussed the role of CRNA in patient's perioperative care. Patient understands.)        Anesthesia Quick Evaluation

## 2024-04-28 LAB — SURGICAL PATHOLOGY

## 2024-05-01 ENCOUNTER — Other Ambulatory Visit: Payer: Self-pay

## 2024-05-03 ENCOUNTER — Ambulatory Visit: Admitting: Oncology

## 2024-05-24 ENCOUNTER — Encounter: Payer: Self-pay | Admitting: Oncology

## 2024-05-24 ENCOUNTER — Inpatient Hospital Stay: Attending: Oncology | Admitting: Oncology

## 2024-05-24 VITALS — BP 118/72 | HR 88 | Temp 97.8°F | Resp 18 | Ht 66.0 in | Wt 117.0 lb

## 2024-05-24 DIAGNOSIS — G629 Polyneuropathy, unspecified: Secondary | ICD-10-CM | POA: Diagnosis not present

## 2024-05-24 DIAGNOSIS — C189 Malignant neoplasm of colon, unspecified: Secondary | ICD-10-CM | POA: Diagnosis not present

## 2024-05-24 DIAGNOSIS — D509 Iron deficiency anemia, unspecified: Secondary | ICD-10-CM | POA: Insufficient documentation

## 2024-05-24 DIAGNOSIS — Z87891 Personal history of nicotine dependence: Secondary | ICD-10-CM | POA: Diagnosis not present

## 2024-05-24 DIAGNOSIS — Z85038 Personal history of other malignant neoplasm of large intestine: Secondary | ICD-10-CM | POA: Insufficient documentation

## 2024-05-24 DIAGNOSIS — Z803 Family history of malignant neoplasm of breast: Secondary | ICD-10-CM | POA: Diagnosis not present

## 2024-05-24 DIAGNOSIS — Z9221 Personal history of antineoplastic chemotherapy: Secondary | ICD-10-CM | POA: Diagnosis not present

## 2024-05-24 NOTE — Progress Notes (Unsigned)
 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  INTERVAL HISTORY: Patient returns to clinic today for routine 33-month evaluation.  She recently had laboratory work and imaging completed at [facility].  She continues to have a mild peripheral neuropathy, but states this is slightly improved.  She otherwise feels well.  She has no other neurologic complaints.  She does not complain of any weakness or fatigue today.  She denies any recent fevers or illnesses.  She has a good appetite and denies weight loss.  She has no chest pain, shortness of breath, cough, or hemoptysis.  She denies any nausea, vomiting, constipation, or diarrhea.  She has no melena or hematochezia.  She has no urinary complaints.  Patient offers no further specific complaints today.  REVIEW OF SYSTEMS:   Review of Systems  Constitutional: Negative.  Negative for fever, malaise/fatigue and weight loss.  Respiratory: Negative.  Negative for cough, hemoptysis and shortness of breath.   Cardiovascular: Negative.  Negative for chest pain and leg swelling.  Gastrointestinal: Negative.  Negative for abdominal pain, blood in stool and melena.  Genitourinary: Negative.  Negative for dysuria.  Musculoskeletal: Negative.  Negative for back pain.  Skin: Negative.  Negative for rash.  Neurological:  Positive for sensory change. Negative for dizziness, focal weakness, weakness and headaches.  Psychiatric/Behavioral: Negative.  The patient is not nervous/anxious.     As per HPI. Otherwise, a complete review of systems is negative.  PAST MEDICAL HISTORY: Past Medical  History:  Diagnosis Date   Adenocarcinoma of colon (HCC) 04/03/2023   a.) 4 x 3 cm cecal mass identified on colonoscopy during workup for IDA. Bx 04/02/2020 --> moderately differentiated adenocarcinoma; MMR stable.   Aortic atherosclerosis    Arteriosclerotic cardiovascular disease    Carpal tunnel syndrome    Current smoker    Hiatal hernia    Hypercholesteremia    IDA (iron  deficiency anemia)     PAST SURGICAL HISTORY: Past Surgical History:  Procedure Laterality Date   COLONOSCOPY     COLONOSCOPY N/A 04/27/2024   Procedure: COLONOSCOPY;  Surgeon: Toledo, Ladell POUR, MD;  Location: [facility] ENDOSCOPY;  Service: Gastroenterology;  Laterality: N/A;   ESOPHAGOGASTRODUODENOSCOPY     POLYPECTOMY  04/27/2024   Procedure: POLYPECTOMY, INTESTINE;  Surgeon: Aundria, Ladell POUR, MD;  Location: [facility] ENDOSCOPY;  Service: Gastroenterology;;   PORTACATH PLACEMENT N/A 05/29/2023   Procedure: INSERTION PORT-A-CATH;  Surgeon: Rodolph Romano, MD;  Location: [facility] ORS;  Service: General;  Laterality: N/A;    FAMILY HISTORY: Family History  Problem Relation Age of Onset   Breast cancer Mother 71   Heart disease Father    Breast cancer Maternal Aunt 58    ADVANCED DIRECTIVES (Y/N):  N  HEALTH MAINTENANCE: Social History   Tobacco Use   Smoking status: Former    Current packs/day: 0.00    Average packs/day: 1 pack/day for 38.0 years (38.0 ttl pk-yrs)    Types: Cigarettes    Quit date: 04/03/2023    Years since quitting: 1.1   Smokeless tobacco: Never  Vaping Use   Vaping status: Never Used  Substance Use Topics   Alcohol use: No    Alcohol/week: 0.0 standard  drinks of alcohol   Drug use: No     Colonoscopy:  PAP:  Bone density:  Lipid panel:  Allergies  Allergen Reactions   Sulfa Antibiotics Hives and Swelling    Current Outpatient Medications  Medication Sig Dispense Refill   atorvastatin (LIPITOR) 40 MG tablet Take 40 mg by mouth at bedtime.     dexamethasone  (DECADRON )  4 MG tablet Take 2 tablets (8 mg total) by mouth daily. Start the day after chemotherapy for 2 days. Take with food. (Patient not taking: Reported on 05/24/2024) 30 tablet 1   ferrous sulfate ER (SLOW FE) 142 (45 Fe) MG TBCR tablet Take 1 tablet by mouth 2 (two) times daily. (Patient not taking: Reported on 05/24/2024)     lidocaine -prilocaine  (EMLA ) cream Apply to affected area once (Patient not taking: Reported on 05/24/2024) 30 g 3   Omega-3 Fatty Acids (FISH OIL) 1000 MG CAPS Take 1 capsule by mouth daily. (Patient not taking: Reported on 05/24/2024)     ondansetron  (ZOFRAN ) 8 MG tablet Take 1 tablet (8 mg total) by mouth every 8 (eight) hours as needed for nausea or vomiting. Start on the third day after chemotherapy. (Patient not taking: Reported on 05/24/2024) 30 tablet 1   predniSONE  (STERAPRED UNI-PAK 21 TAB) 10 MG (21) TBPK tablet Taper as directed (Patient not taking: Reported on 05/24/2024) 21 tablet 0   prochlorperazine  (COMPAZINE ) 10 MG tablet Take 1 tablet (10 mg total) by mouth every 6 (six) hours as needed for nausea or vomiting. (Patient not taking: Reported on 05/24/2024) 30 tablet 1   No current facility-administered medications for this visit.    OBJECTIVE: Vitals:   05/24/24 1002  BP: 118/72  Pulse: 88  Resp: 18  Temp: 97.8 F (36.6 C)  SpO2: 99%      Body mass index is 18.88 kg/m.    ECOG FS:0 - Asymptomatic  General: Well-developed, well-nourished, no acute distress. Eyes: Pink conjunctiva, anicteric sclera. HEENT: Normocephalic, moist mucous membranes. Lungs: No audible wheezing or coughing. Heart: Regular rate and rhythm. Abdomen: Soft, nontender, no obvious distention. Musculoskeletal: No edema, cyanosis, or clubbing. Neuro: Alert, answering all questions appropriately. Cranial nerves grossly intact. Skin: No rashes or petechiae noted. Psych: Normal affect.  LAB RESULTS:  Lab Results  Component Value Date   NA 134 (L) 12/21/2023   K 3.5 12/21/2023   CL 102  12/21/2023   CO2 24 12/21/2023   GLUCOSE 111 (H) 12/21/2023   BUN 16 12/21/2023   CREATININE 0.69 12/21/2023   CALCIUM  8.5 (L) 12/21/2023   PROT 7.3 12/21/2023   ALBUMIN  3.6 12/21/2023   AST 30 12/21/2023   ALT 28 12/21/2023   ALKPHOS 115 12/21/2023   BILITOT 1.4 (H) 12/21/2023   GFRNONAA >60 12/21/2023    Lab Results  Component Value Date   WBC 4.3 12/21/2023   NEUTROABS 2.4 12/21/2023   HGB 11.2 (L) 12/21/2023   HCT 32.6 (L) 12/21/2023   MCV 96.7 12/21/2023   PLT 107 (L) 12/21/2023     STUDIES: No results found.  ONCOLOGY HISTORY:  Patient underwent right hemicolectomy with lymph node dissection on April 22, 2023.  MRI of the liver on May 06, 2023 showed a 2.4 cm lesion in the liver which biopsy confirmed an isolated metastatic lesion.  Consultation at [facility] recommended neoadjuvant chemotherapy with FOLFOX for 15-months followed by metastectomy of liver lesion.  CEA was within normal limits at 2.8.  CT scan on August 09, 2023 as well as imaging from  [facility] on August 23, 2023 revealed decreased size of known liver lesion with no other evidence of disease.  Patient underwent partial hepatectomy on September 29, 2023 which revealed complete pathologic response.  Recommendation was then 3 additional months of FOLFOX which patient completed on Dec 21, 2023.  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  Imaging from [facility] on May 16, 2024 reviewed independently no obvious evidence of recurrent or progressive disease.  CEA also continues to be within normal limits at 2.3.  No further intervention is needed.  Return to clinic in 3 months after imaging and evaluation at [facility].  Iron  deficiency anemia: Chronic and unchanged.  Patient's most recent hemoglobin is 11.3.  Patient last received IV  Venofer  on May 04, 2023. Peripheral neuropathy: Mildly improved.    Patient expressed understanding and was in agreement with this plan. She also understands that She can call clinic at any time with any questions, concerns, or complaints.    Cancer Staging  Colon adenocarcinoma [employer] [facility]) Staging form: Colon and Rectum, AJCC 8th Edition - Pathologic: pT3, pN2b, pM1 - Signed by Agrawal, Kavita, MD on 05/18/2023 Stage prefix: Initial diagnosis Total positive nodes: 7 Total nodes examined: 60 Histologic grading system: 4 grade system Histologic grade (G): G2   Evalene JINNY Reusing, MD   05/24/2024 10:19 AM

## 2024-05-25 ENCOUNTER — Encounter: Payer: Self-pay | Admitting: Internal Medicine

## 2024-05-25 ENCOUNTER — Encounter: Payer: Self-pay | Admitting: Oncology

## 2024-05-26 ENCOUNTER — Other Ambulatory Visit: Payer: Self-pay

## 2024-09-14 ENCOUNTER — Encounter: Payer: Self-pay | Admitting: Oncology

## 2024-09-14 ENCOUNTER — Inpatient Hospital Stay: Attending: Oncology | Admitting: Oncology

## 2024-09-14 VITALS — BP 158/72 | HR 75 | Temp 97.9°F | Resp 18 | Ht 66.0 in | Wt 120.0 lb

## 2024-09-14 DIAGNOSIS — I10 Essential (primary) hypertension: Secondary | ICD-10-CM | POA: Insufficient documentation

## 2024-09-14 DIAGNOSIS — Z87891 Personal history of nicotine dependence: Secondary | ICD-10-CM | POA: Insufficient documentation

## 2024-09-14 DIAGNOSIS — C787 Secondary malignant neoplasm of liver and intrahepatic bile duct: Secondary | ICD-10-CM | POA: Insufficient documentation

## 2024-09-14 DIAGNOSIS — G629 Polyneuropathy, unspecified: Secondary | ICD-10-CM | POA: Insufficient documentation

## 2024-09-14 DIAGNOSIS — D509 Iron deficiency anemia, unspecified: Secondary | ICD-10-CM | POA: Insufficient documentation

## 2024-09-14 DIAGNOSIS — Z803 Family history of malignant neoplasm of breast: Secondary | ICD-10-CM | POA: Insufficient documentation

## 2024-09-14 DIAGNOSIS — C189 Malignant neoplasm of colon, unspecified: Secondary | ICD-10-CM | POA: Insufficient documentation

## 2024-09-14 NOTE — Progress Notes (Signed)
 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  INTERVAL HISTORY: Patient returns to clinic today for routine 25-month evaluation.  She recently had her laboratory work and imaging completed at [facility] and continues to have no evidence of disease.  She does not complain of peripheral neuropathy today.  She has no neurologic complaints. She does not complain of any weakness or fatigue today.  She denies any recent fevers or illnesses.  She has a good appetite and denies weight loss.  She has no chest pain, shortness of breath, cough, or hemoptysis.  She denies any nausea, vomiting, constipation, or diarrhea.  She has no melena or hematochezia.  She has no urinary complaints.  Patient offers no specific complaints today.  REVIEW OF SYSTEMS:   Review of Systems  Constitutional: Negative.  Negative for fever, malaise/fatigue and weight loss.  Respiratory: Negative.  Negative for cough, hemoptysis and shortness of breath.   Cardiovascular: Negative.  Negative for chest pain and leg swelling.  Gastrointestinal: Negative.  Negative for abdominal pain, blood in stool and melena.  Genitourinary: Negative.  Negative for dysuria.  Musculoskeletal: Negative.  Negative for back pain.  Skin: Negative.  Negative for rash.  Neurological: Negative.  Negative for dizziness, sensory change, focal weakness, weakness and headaches.  Psychiatric/Behavioral: Negative.  The patient is not nervous/anxious.     As per HPI. Otherwise, a complete review of systems is negative.  PAST MEDICAL HISTORY: Past Medical History:  Diagnosis Date    Adenocarcinoma of colon (HCC) 04/03/2023   a.) 4 x 3 cm cecal mass identified on colonoscopy during workup for IDA. Bx 04/02/2020 --> moderately differentiated adenocarcinoma; MMR stable.   Aortic atherosclerosis    Arteriosclerotic cardiovascular disease    Carpal tunnel syndrome    Current smoker    Hiatal hernia    Hypercholesteremia    IDA (iron  deficiency anemia)     PAST SURGICAL HISTORY: Past Surgical History:  Procedure Laterality Date   COLONOSCOPY     COLONOSCOPY N/A 04/27/2024   Procedure: COLONOSCOPY;  Surgeon: Toledo, Ladell POUR, MD;  Location: [facility] ENDOSCOPY;  Service: Gastroenterology;  Laterality: N/A;   ESOPHAGOGASTRODUODENOSCOPY     POLYPECTOMY  04/27/2024   Procedure: POLYPECTOMY, INTESTINE;  Surgeon: Aundria, Ladell POUR, MD;  Location: [facility] ENDOSCOPY;  Service: Gastroenterology;;   PORTACATH PLACEMENT N/A 05/29/2023   Procedure: INSERTION PORT-A-CATH;  Surgeon: Rodolph Romano, MD;  Location: [facility] ORS;  Service: General;  Laterality: N/A;    FAMILY HISTORY: Family History  Problem Relation Age of Onset   Breast cancer Mother 29   Heart disease Father    Breast cancer Maternal Aunt 58    ADVANCED DIRECTIVES (Y/N):  N  HEALTH MAINTENANCE: Social History   Tobacco Use   Smoking status: Former    Current packs/day: 0.00    Average packs/day: 1 pack/day for 38.0 years (38.0 ttl pk-yrs)    Types: Cigarettes    Quit date: 04/03/2023    Years since quitting: 1.4   Smokeless tobacco: Never  Vaping Use   Vaping status: Never Used  Substance Use Topics   Alcohol use: No    Alcohol/week: 0.0 standard drinks of alcohol  Drug use: No     Colonoscopy:  PAP:  Bone density:  Lipid panel:  Allergies  Allergen Reactions   Sulfa Antibiotics Hives and Swelling    Current Outpatient Medications  Medication Sig Dispense Refill   atorvastatin (LIPITOR) 40 MG tablet Take 40 mg by mouth at bedtime.     dexamethasone  (DECADRON ) 4 MG tablet Take 2 tablets (8  mg total) by mouth daily. Start the day after chemotherapy for 2 days. Take with food. (Patient not taking: Reported on 09/14/2024) 30 tablet 1   ferrous sulfate ER (SLOW FE) 142 (45 Fe) MG TBCR tablet Take 1 tablet by mouth 2 (two) times daily. (Patient not taking: Reported on 09/14/2024)     lidocaine -prilocaine  (EMLA ) cream Apply to affected area once (Patient not taking: Reported on 09/14/2024) 30 g 3   Omega-3 Fatty Acids (FISH OIL) 1000 MG CAPS Take 1 capsule by mouth daily. (Patient not taking: Reported on 09/14/2024)     ondansetron  (ZOFRAN ) 8 MG tablet Take 1 tablet (8 mg total) by mouth every 8 (eight) hours as needed for nausea or vomiting. Start on the third day after chemotherapy. (Patient not taking: Reported on 09/14/2024) 30 tablet 1   predniSONE  (STERAPRED UNI-PAK 21 TAB) 10 MG (21) TBPK tablet Taper as directed (Patient not taking: Reported on 09/14/2024) 21 tablet 0   prochlorperazine  (COMPAZINE ) 10 MG tablet Take 1 tablet (10 mg total) by mouth every 6 (six) hours as needed for nausea or vomiting. (Patient not taking: Reported on 09/14/2024) 30 tablet 1   No current facility-administered medications for this visit.    OBJECTIVE: Vitals:   09/14/24 1052  BP: (!) 158/72  Pulse: 75  Resp: 18  Temp: 97.9 F (36.6 C)  SpO2: 100%      Body mass index is 19.37 kg/m.    ECOG FS:0 - Asymptomatic  General: Well-developed, well-nourished, no acute distress. Eyes: Pink conjunctiva, anicteric sclera. HEENT: Normocephalic, moist mucous membranes. Lungs: No audible wheezing or coughing. Heart: Regular rate and rhythm. Abdomen: Soft, nontender, no obvious distention. Musculoskeletal: No edema, cyanosis, or clubbing. Neuro: Alert, answering all questions appropriately. Cranial nerves grossly intact. Skin: No rashes or petechiae noted. Psych: Normal affect.  LAB RESULTS:  Lab Results  Component Value Date   NA 134 (L) 12/21/2023   K 3.5 12/21/2023   CL 102 12/21/2023   CO2 24  12/21/2023   GLUCOSE 111 (H) 12/21/2023   BUN 16 12/21/2023   CREATININE 0.69 12/21/2023   CALCIUM  8.5 (L) 12/21/2023   PROT 7.3 12/21/2023   ALBUMIN  3.6 12/21/2023   AST 30 12/21/2023   ALT 28 12/21/2023   ALKPHOS 115 12/21/2023   BILITOT 1.4 (H) 12/21/2023   GFRNONAA >60 12/21/2023    Lab Results  Component Value Date   WBC 4.3 12/21/2023   NEUTROABS 2.4 12/21/2023   HGB 11.2 (L) 12/21/2023   HCT 32.6 (L) 12/21/2023   MCV 96.7 12/21/2023   PLT 107 (L) 12/21/2023     STUDIES: No results found.  ONCOLOGY HISTORY:  Patient underwent right hemicolectomy with lymph node dissection on April 22, 2023.  MRI of the liver on May 06, 2023 showed a 2.4 cm lesion in the liver which biopsy confirmed an isolated metastatic lesion.  Consultation at [facility] recommended neoadjuvant chemotherapy with FOLFOX for 65-months followed by metastectomy of liver lesion.  CEA was within normal limits at 2.8.  CT scan on August 09, 2023 as well as imaging from [facility] on August 23, 2023 revealed decreased size of known liver lesion with no other evidence of disease.  Patient underwent partial hepatectomy on September 29, 2023 which revealed complete pathologic response.  Recommendation was then 3 additional months of FOLFOX which patient completed on Dec 21, 2023.  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  Her most recent colonoscopy on April 27, 2024 did not report any significant pathology.  Repeat in September 2026.  Patient's most recent imaging from [facility] on August 15, 2024 reviewed independently with no obvious evidence of recurrent or progressive disease.  CEA continues to be within normal limits at 2.1.  Per NCCN guidelines, will continue surveillance every 3 months for 2 years through May 2027 and then every 6  months until patient is 5 years removed from her last treatments.  Return to clinic in 3 months after her [facility] appointment for further evaluation.   Iron  deficiency anemia: Chronic and unchanged.  Patient's most recent hemoglobin is 11.1.  Patient last received IV Venofer  on May 04, 2023. Peripheral neuropathy: Patient does not complain of this today. Hypertension: Mild, monitor.  Continue monitoring and treatment per primary care.  I spent a total of 20 minutes reviewing chart data, face-to-face evaluation with the patient, counseling and coordination of care as detailed above.   Patient expressed understanding and was in agreement with this plan. She also understands that She can call clinic at any time with any questions, concerns, or complaints.    Cancer Staging  Colon adenocarcinoma [facility]) Staging form: Colon and Rectum, AJCC 8th Edition - Pathologic: pT3, pN2b, pM1 - Signed by Agrawal, Kavita, MD on 05/18/2023 Stage prefix: Initial diagnosis Total positive nodes: 7 Total nodes examined: 60 Histologic grading system: 4 grade system Histologic grade (G): G2   Shari JINNY Reusing, MD   09/14/2024 11:17 AM     "

## 2024-09-15 ENCOUNTER — Other Ambulatory Visit: Payer: Self-pay

## 2024-10-26 ENCOUNTER — Inpatient Hospital Stay

## 2024-12-14 ENCOUNTER — Inpatient Hospital Stay

## 2024-12-14 ENCOUNTER — Inpatient Hospital Stay: Admitting: Oncology
